# Patient Record
Sex: Male | Born: 1950
Health system: Southern US, Community
[De-identification: ages and names within clinical notes are randomized; demographics above are authoritative.]

## PROBLEM LIST (undated history)

## (undated) DIAGNOSIS — C801 Malignant (primary) neoplasm, unspecified: Secondary | ICD-10-CM

## (undated) DIAGNOSIS — M1990 Unspecified osteoarthritis, unspecified site: Secondary | ICD-10-CM

## (undated) DIAGNOSIS — E785 Hyperlipidemia, unspecified: Secondary | ICD-10-CM

## (undated) DIAGNOSIS — N529 Male erectile dysfunction, unspecified: Secondary | ICD-10-CM

## (undated) DIAGNOSIS — I10 Essential (primary) hypertension: Secondary | ICD-10-CM

## (undated) HISTORY — DX: Malignant (primary) neoplasm, unspecified: C80.1

## (undated) HISTORY — DX: Unspecified osteoarthritis, unspecified site: M19.90

## (undated) HISTORY — DX: Hyperlipidemia, unspecified: E78.5

## (undated) HISTORY — PX: NO PAST SURGERIES: SHX2092

## (undated) HISTORY — DX: Essential (primary) hypertension: I10

## (undated) HISTORY — DX: Male erectile dysfunction, unspecified: N52.9

---

## 2012-01-26 ENCOUNTER — Ambulatory Visit (INDEPENDENT_AMBULATORY_CARE_PROVIDER_SITE_OTHER): Payer: 59 | Admitting: Family Medicine

## 2012-01-26 ENCOUNTER — Encounter: Payer: Self-pay | Admitting: Family Medicine

## 2012-01-26 VITALS — BP 140/90 | HR 100 | Temp 98.0°F | Resp 12 | Ht 69.0 in | Wt 200.0 lb

## 2012-01-26 DIAGNOSIS — I10 Essential (primary) hypertension: Secondary | ICD-10-CM

## 2012-01-26 DIAGNOSIS — M1990 Unspecified osteoarthritis, unspecified site: Secondary | ICD-10-CM

## 2012-01-26 DIAGNOSIS — N529 Male erectile dysfunction, unspecified: Secondary | ICD-10-CM

## 2012-01-26 MED ORDER — SILDENAFIL CITRATE 100 MG PO TABS: 100.0000 mg | ORAL_TABLET | Freq: Every day | ORAL | Status: DC | PRN

## 2012-01-26 MED ORDER — LISINOPRIL 10 MG PO TABS: 10.0000 mg | ORAL_TABLET | Freq: Every day | ORAL | Status: DC

## 2012-01-26 NOTE — Patient Instructions (Addendum)
Consider complete physical at some point later this year. 

## 2012-01-26 NOTE — Progress Notes (Signed)
  Subjective:    Patient ID: Stephen Woods, male    DOB: Nov 04, 1950, 61 y.o.   MRN: [id number]  HPI  Patient seen to establish care. He has history of osteoarthritis mostly involving back, shoulders, knees. He takes over-the-counter anti-inflammatories. Had some recent low back pain possibly related to some lifting. No radiculopathy symptoms. Pain is mild to moderate. Exacerbated by movement.  Hypertension treated with lisinopril 10 mg daily. Blood pressure generally well-controlled. No history of cardiac disease. No prior surgeries. No other prescription medications. No known drug allergies.  Family history is positive for both parents having coronary disease. There were both smokers. Father coronary disease in his 41s and mother 55s. Brother died of complications type 2 diabetes. Father had history of stroke.  Patient recently moved here from [facility] to live with his fianc. Nonsmoker. Only rare alcohol use.  Doing well with the exception of recent problems with erectile dysfunction. No fatigue issues. Normal libido. Symptoms present for about 2 months   Review of Systems  Constitutional: Negative for fatigue and unexpected weight change.  Eyes: Negative for visual disturbance.  Respiratory: Negative for cough, chest tightness and shortness of breath.   Cardiovascular: Negative for chest pain, palpitations and leg swelling.  Gastrointestinal: Negative for abdominal pain.  Genitourinary: Negative for dysuria.  Musculoskeletal: Positive for back pain and arthralgias.  Neurological: Negative for dizziness, syncope, weakness, light-headedness and headaches.       Objective:   Physical Exam  Constitutional: He appears well-developed and well-nourished.  HENT:  Right Ear: External ear normal.  Left Ear: External ear normal.  Mouth/Throat: Oropharynx is clear and moist.  Neck: Neck supple. No thyromegaly present.  Cardiovascular: Normal rate and regular rhythm.  Exam reveals  no gallop.   Pulmonary/Chest: Effort normal and breath sounds normal. No respiratory distress. He has no wheezes. He has no rales.  Musculoskeletal: He exhibits no edema.  Lymphadenopathy:    He has no cervical adenopathy.  Neurological: He is alert.  Psychiatric: He has a normal mood and affect. His behavior is normal.          Assessment & Plan:  #1 hypertension. Stable. Refill lisinopril for one year #2 erectile dysfunction. Discussed risk factors. Trial Viagra 100 mg one half to one tablet daily as needed. No contraindications. #3 osteoarthritis involving multiple joints. Not limiting. Discussed cautions of daily chronic nonsteroidal use #4 recommend complete physical later this year. Flu vaccine declined. He's never had colonoscopy screening but declines.

## 2012-02-20 ENCOUNTER — Telehealth: Payer: Self-pay | Admitting: Family Medicine

## 2012-02-20 NOTE — Telephone Encounter (Signed)
Pt is sch for 03-02-2012 415pm

## 2012-02-20 NOTE — Telephone Encounter (Signed)
Stephen Woods, please schedule pt as requested by Dr Caryl Never, unless he can wait for CPX in December.  Thank you

## 2012-02-20 NOTE — Telephone Encounter (Signed)
Pt is having back pain and  requesting a scan on back.

## 2012-02-20 NOTE — Telephone Encounter (Signed)
New pt 01/26/12, and has CPX labs scheduled, CPX 03/19/12.  Schedule ROV to discuss prior to scan?  Pt has hx of chronic back pain.

## 2012-02-20 NOTE — Telephone Encounter (Signed)
Office follow up to reassess.

## 2012-03-02 ENCOUNTER — Ambulatory Visit (INDEPENDENT_AMBULATORY_CARE_PROVIDER_SITE_OTHER): Payer: 59 | Admitting: Family Medicine

## 2012-03-02 ENCOUNTER — Encounter: Payer: Self-pay | Admitting: Family Medicine

## 2012-03-02 VITALS — BP 120/74 | HR 78 | Temp 97.9°F | Resp 16 | Wt 204.0 lb

## 2012-03-02 DIAGNOSIS — M545 Low back pain: Secondary | ICD-10-CM

## 2012-03-02 DIAGNOSIS — IMO0002 Reserved for concepts with insufficient information to code with codable children: Secondary | ICD-10-CM

## 2012-03-02 DIAGNOSIS — L03114 Cellulitis of left upper limb: Secondary | ICD-10-CM

## 2012-03-02 MED ORDER — CEPHALEXIN 500 MG PO CAPS: 500.0000 mg | ORAL_CAPSULE | Freq: Three times a day (TID) | ORAL | Status: DC

## 2012-03-02 MED ORDER — CELECOXIB 200 MG PO CAPS: 200.0000 mg | ORAL_CAPSULE | Freq: Every day | ORAL | Status: DC

## 2012-03-02 NOTE — Progress Notes (Signed)
  Subjective:    Patient ID: Stephen Woods, male    DOB: 30-May-1950, 61 y.o.   MRN: [id number]  HPI  Patient seen for followup regarding low back pain. Actually improved some since last visit. Some more generalized osteoarthritis. Currently taking ibuprofen. Previously has used Celebrex with good success. He would like to consider prescription for Celebrex. No history of gastrointestinal problems. Denies any epigastric pain or melena.  Left forearm skin lesion. Does not recall specific injury but was carrying some wood couple days ago. Does notice some surrounding redness today. No fever or chills.   Review of Systems  Constitutional: Negative for fever and chills.  Respiratory: Negative for cough and shortness of breath.   Cardiovascular: Negative for chest pain.  Musculoskeletal: Positive for back pain.       Objective:   Physical Exam  Constitutional: He appears well-developed and well-nourished.  Cardiovascular: Normal rate and regular rhythm.   Pulmonary/Chest: Effort normal and breath sounds normal. No respiratory distress. He has no wheezes. He has no rales.  Skin:       Left forearm reveals abraded-type area approximately 2 cm diameter dorsum distal forearm. He has some surrounding erythema which spreads to the ventral aspect of the forearm. This is slightly tender to palpation slightly warm to touch.          Assessment & Plan:  #1 cellulitis left forearm. Keflex 500 mg 3 times a day for 10 days. Followup promptly for any worsening symptoms or if not resolving over the next few days  #2 chronic intermittent low back pain with suspected degenerative arthritis. Prescription for Celebrex 200 mg once daily which has worked well for him in the past.

## 2012-03-02 NOTE — Patient Instructions (Addendum)
Cellulitis Cellulitis is an infection of the skin and the tissue beneath it. The infected area is usually red and tender. Cellulitis occurs most often in the arms and lower legs.   CAUSES   Cellulitis is caused by bacteria that enter the skin through cracks or cuts in the skin. The most common types of bacteria that cause cellulitis are Staphylococcus and Streptococcus. SYMPTOMS    Redness and warmth.   Swelling.   Tenderness or pain.   Fever.  DIAGNOSIS  Your caregiver can usually determine what is wrong based on a physical exam. Blood tests may also be done. TREATMENT   Treatment usually involves taking an antibiotic medicine. HOME CARE INSTRUCTIONS    Take your antibiotics as directed. Finish them even if you start to feel better.   Keep the infected arm or leg elevated to reduce swelling.   Apply a warm cloth to the affected area up to 4 times per day to relieve pain.   Only take over-the-counter or prescription medicines for pain, discomfort, or fever as directed by your caregiver.   Keep all follow-up appointments as directed by your caregiver.  SEEK MEDICAL CARE IF:    You notice red streaks coming from the infected area.   Your red area gets larger or turns dark in color.   Your bone or joint underneath the infected area becomes painful after the skin has healed.   Your infection returns in the same area or another area.   You notice a swollen bump in the infected area.   You develop new symptoms.  SEEK IMMEDIATE MEDICAL CARE IF:    You have a fever.   You feel very sleepy.   You develop vomiting or diarrhea.   You have a general ill feeling (malaise) with muscle aches and pains.  MAKE SURE YOU:    Understand these instructions.   Will watch your condition.   Will get help right away if you are not doing well or get worse.  Document Released: 01/12/2005 Document Revised: 10/04/2011 Document Reviewed: 06/20/2011 [employer] Patient Information 2013  [employer]    

## 2012-03-05 ENCOUNTER — Other Ambulatory Visit (INDEPENDENT_AMBULATORY_CARE_PROVIDER_SITE_OTHER): Payer: 59

## 2012-03-05 DIAGNOSIS — Z0000 Encounter for general adult medical examination without abnormal findings: Secondary | ICD-10-CM

## 2012-03-05 LAB — CBC WITH DIFFERENTIAL/PLATELET
Basophils Relative: 0.8 % (ref 0.0–3.0)
Eosinophils Absolute: 0.3 10*3/uL (ref 0.0–0.7)
Eosinophils Relative: 5 % (ref 0.0–5.0)
Hemoglobin: 15.6 g/dL (ref 13.0–17.0)
Lymphocytes Relative: 37.1 % (ref 12.0–46.0)
MCHC: 33 g/dL (ref 30.0–36.0)
Monocytes Relative: 11.9 % (ref 3.0–12.0)
Neutro Abs: 2.6 10*3/uL (ref 1.4–7.7)
Neutrophils Relative %: 45.2 % (ref 43.0–77.0)
RBC: 4.98 Mil/uL (ref 4.22–5.81)
WBC: 5.7 10*3/uL (ref 4.5–10.5)

## 2012-03-05 LAB — POCT URINALYSIS DIPSTICK
Bilirubin, UA: NEGATIVE
Glucose, UA: NEGATIVE
Leukocytes, UA: NEGATIVE
Nitrite, UA: NEGATIVE
Urobilinogen, UA: 0.2

## 2012-03-05 LAB — HEPATIC FUNCTION PANEL
AST: 39 U/L — ABNORMAL HIGH (ref 0–37)
Alkaline Phosphatase: 40 U/L (ref 39–117)
Bilirubin, Direct: 0.2 mg/dL (ref 0.0–0.3)
Total Protein: 7.6 g/dL (ref 6.0–8.3)

## 2012-03-05 LAB — BASIC METABOLIC PANEL
Calcium: 9.6 mg/dL (ref 8.4–10.5)
Creatinine, Ser: 0.8 mg/dL (ref 0.4–1.5)
GFR: 100.09 mL/min (ref >60.00)
Sodium: 136 mEq/L (ref 135–145)

## 2012-03-05 LAB — LIPID PANEL
HDL: 60.6 mg/dL (ref >39.00)
Triglycerides: 84 mg/dL (ref 0.0–149.0)

## 2012-03-07 ENCOUNTER — Other Ambulatory Visit: Payer: 59

## 2012-03-19 ENCOUNTER — Encounter: Payer: 59 | Admitting: Family Medicine

## 2012-04-13 ENCOUNTER — Encounter: Payer: Self-pay | Admitting: Family Medicine

## 2012-04-13 ENCOUNTER — Ambulatory Visit (INDEPENDENT_AMBULATORY_CARE_PROVIDER_SITE_OTHER): Payer: 59 | Admitting: Family Medicine

## 2012-04-13 VITALS — BP 124/80 | HR 80 | Temp 98.3°F | Resp 16 | Ht 69.0 in | Wt 202.0 lb

## 2012-04-13 DIAGNOSIS — E785 Hyperlipidemia, unspecified: Secondary | ICD-10-CM

## 2012-04-13 DIAGNOSIS — R319 Hematuria, unspecified: Secondary | ICD-10-CM

## 2012-04-13 DIAGNOSIS — Z0000 Encounter for general adult medical examination without abnormal findings: Secondary | ICD-10-CM

## 2012-04-13 LAB — POCT URINALYSIS DIPSTICK
Bilirubin, UA: NEGATIVE
Blood, UA: NEGATIVE
Glucose, UA: NEGATIVE
Leukocytes, UA: NEGATIVE
Nitrite, UA: NEGATIVE
pH, UA: 6

## 2012-04-13 MED ORDER — ATORVASTATIN CALCIUM 20 MG PO TABS: 20.0000 mg | ORAL_TABLET | Freq: Every day | ORAL | Status: DC

## 2012-04-13 NOTE — Patient Instructions (Addendum)
Consider yearly flu vaccine Consider one time shingles vaccine Let me know if you are willing to schedule colonoscopy.

## 2012-04-13 NOTE — Progress Notes (Signed)
  Subjective:    Patient ID: Stephen Woods, male    DOB: 12/14/1950, 61 y.o.   MRN: [id number]  HPI Here for complete physical. Patient has history of osteoarthritis and hypertension. Blood pressures been well controlled. Last tetanus 2008. Patient declines flu vaccine. No history of screening colonoscopy and he declines. Never had shingles vaccine. Strong family history of CAD in both parents and brother. Patient never had any chest pains. Nonsmoker. No regular exercise.  Past Medical History  Diagnosis Date  . Arthritis   . Hypertension   . Erectile dysfunction    No past surgical history on file.  reports that he has never smoked. He does not have any smokeless tobacco history on file. His alcohol and drug histories not on file. family history includes Arthritis in his mother; Diabetes in his brother; Heart disease (age of onset:55) in his father; Heart disease (age of onset:60) in his mother; Heart disease (age of onset:64) in his brother; Hypertension in his father; and Stroke in his father. Allergies  Allergen Reactions  . Ampicillin     hives      Review of Systems  Constitutional: Negative for fever, activity change, appetite change, fatigue and unexpected weight change.  HENT: Negative for ear pain, congestion and trouble swallowing.   Eyes: Negative for pain and visual disturbance.  Respiratory: Negative for cough, shortness of breath and wheezing.   Cardiovascular: Negative for chest pain and palpitations.  Gastrointestinal: Negative for nausea, vomiting, abdominal pain, diarrhea, constipation, blood in stool, abdominal distention and rectal pain.  Genitourinary: Negative for dysuria, hematuria and testicular pain.  Musculoskeletal: Positive for arthralgias. Negative for joint swelling.  Skin: Negative for rash.  Neurological: Negative for dizziness, syncope and headaches.  Hematological: Negative for adenopathy.  Psychiatric/Behavioral: Negative for confusion and  dysphoric mood.       Objective:   Physical Exam  Constitutional: He is oriented to person, place, and time. He appears well-developed and well-nourished. No distress.  HENT:  Head: Normocephalic and atraumatic.  Right Ear: External ear normal.  Left Ear: External ear normal.  Mouth/Throat: Oropharynx is clear and moist.  Eyes: Conjunctivae normal and EOM are normal. Pupils are equal, round, and reactive to light.  Neck: Normal range of motion. Neck supple. No thyromegaly present.  Cardiovascular: Normal rate, regular rhythm and normal heart sounds.   No murmur heard. Pulmonary/Chest: No respiratory distress. He has no wheezes. He has no rales.  Abdominal: Soft. Bowel sounds are normal. He exhibits no distension and no mass. There is no tenderness. There is no rebound and no guarding.  Genitourinary: Rectum normal and prostate normal.  Musculoskeletal: He exhibits no edema.  Lymphadenopathy:    He has no cervical adenopathy.  Neurological: He is alert and oriented to person, place, and time. He displays normal reflexes. No cranial nerve deficit.  Skin: No rash noted.  Psychiatric: He has a normal mood and affect.          Assessment & Plan:  Complete physical. Tetanus up-to-date. Patient declines flu vaccine or shingles vaccine. Declines colonoscopy. Agrees to Hemoccult cards. 12% ATP risk of CAD in 10 years. Labs reviewed with patient. Given strong family history of premature CAD start Lipitor 20 mg daily and repeat lipids and hepatic in 6-8 weeks. Repeat urine dipstick with trace blood noted initially. Urine micro-if still positive

## 2012-05-21 ENCOUNTER — Telehealth: Payer: Self-pay | Admitting: Family Medicine

## 2012-05-21 ENCOUNTER — Encounter: Payer: Self-pay | Admitting: Family Medicine

## 2012-05-21 NOTE — Telephone Encounter (Signed)
A follow up appointment made with Dr Caryl Never to review lab results.

## 2012-05-21 NOTE — Telephone Encounter (Signed)
Pt is trying to get life insurance and got cholesterol test result of 134. Pt has been on  LIPITOR. Pls advise.  OK to leave message.

## 2012-06-04 ENCOUNTER — Encounter: Payer: Self-pay | Admitting: Family Medicine

## 2012-06-04 ENCOUNTER — Ambulatory Visit (INDEPENDENT_AMBULATORY_CARE_PROVIDER_SITE_OTHER): Payer: 59 | Admitting: Family Medicine

## 2012-06-04 ENCOUNTER — Other Ambulatory Visit: Payer: 59

## 2012-06-04 VITALS — BP 140/80 | Temp 98.4°F | Wt 202.0 lb

## 2012-06-04 DIAGNOSIS — E785 Hyperlipidemia, unspecified: Secondary | ICD-10-CM

## 2012-06-04 LAB — LIPID PANEL
HDL: 47.3 mg/dL (ref >39.00)
Triglycerides: 86 mg/dL (ref 0.0–149.0)
VLDL: 17.2 mg/dL (ref 0.0–40.0)

## 2012-06-04 LAB — HEPATIC FUNCTION PANEL
ALT: 30 U/L (ref 0–53)
AST: 26 U/L (ref 0–37)
Albumin: 4 g/dL (ref 3.5–5.2)
Total Bilirubin: 0.6 mg/dL (ref 0.3–1.2)
Total Protein: 7.4 g/dL (ref 6.0–8.3)

## 2012-06-04 NOTE — Progress Notes (Signed)
  Subjective:    Patient ID: Stephen Woods, male    DOB: 22-Feb-1951, 62 y.o.   MRN: [id number]  HPI Followup hyperlipidemia. Patient recent physical. Predicted 10 year risk of CAD 12%. Strong family history. Cholesterol 239. Started Lipitor 20 mg once daily. Tolerating well without side effects. Patient relates insurance physical last month with total cholesterol 138 and he was told this was" too low". He apparently had mildly elevated liver transaminases. No alcohol use.  Past Medical History  Diagnosis Date  . Arthritis   . Hypertension   . Erectile dysfunction    No past surgical history on file.  reports that he has quit smoking. He started smoking about 8 weeks ago. He does not have any smokeless tobacco history on file. His alcohol and drug histories are not on file. family history includes Arthritis in his mother; Diabetes in his brother; Heart disease (age of onset: 51) in his father; Heart disease (age of onset: 62) in his mother; Heart disease (age of onset: 53) in his brother; Hypertension in his father; and Stroke in his father. Allergies  Allergen Reactions  . Ampicillin     hives      Review of Systems  Constitutional: Negative for fatigue.  Eyes: Negative for visual disturbance.  Respiratory: Negative for cough, chest tightness and shortness of breath.   Cardiovascular: Negative for chest pain, palpitations and leg swelling.  Neurological: Negative for dizziness, syncope, weakness, light-headedness and headaches.       Objective:   Physical Exam  Constitutional: He appears well-developed and well-nourished. No distress.  Cardiovascular: Normal rate and regular rhythm.   Pulmonary/Chest: Effort normal and breath sounds normal. No respiratory distress. He has no wheezes. He has no rales.  Musculoskeletal: He exhibits no edema.          Assessment & Plan:  Hyperlipidemia. Reported recent mild elevated liver transaminases with insurance physical. Recheck lipid  and hepatic panel today.

## 2012-06-06 NOTE — Progress Notes (Signed)
Quick Note:  Pt informed on only phone number available VM ______

## 2012-06-07 ENCOUNTER — Encounter: Payer: Self-pay | Admitting: Family Medicine

## 2012-07-30 ENCOUNTER — Telehealth: Payer: Self-pay | Admitting: Family Medicine

## 2012-07-30 MED ORDER — SILDENAFIL CITRATE 100 MG PO TABS: 100.0000 mg | ORAL_TABLET | Freq: Every day | ORAL | Status: DC | PRN

## 2012-07-30 NOTE — Telephone Encounter (Signed)
Pt wife calling to request a RX of Sildenafil 20mg  #40. PT would like it called into [facility] on [address].

## 2012-07-31 ENCOUNTER — Other Ambulatory Visit: Payer: Self-pay | Admitting: *Deleted

## 2012-07-31 ENCOUNTER — Telehealth: Payer: Self-pay | Admitting: Family Medicine

## 2012-07-31 MED ORDER — SILDENAFIL CITRATE 100 MG PO TABS: 100.0000 mg | ORAL_TABLET | Freq: Every day | ORAL | Status: DC | PRN

## 2012-07-31 NOTE — Telephone Encounter (Signed)
Patient's wife called back to ask if he can have a quantity of 40 viagra at a time as this will be cheaper and they will even pay out of pocket if necessary. Please advise.

## 2012-08-01 ENCOUNTER — Telehealth: Payer: Self-pay | Admitting: *Deleted

## 2012-08-01 MED ORDER — SILDENAFIL CITRATE 100 MG PO TABS: 100.0000 mg | ORAL_TABLET | Freq: Every day | ORAL | Status: DC | PRN

## 2012-08-01 MED ORDER — SILDENAFIL CITRATE 20 MG PO TABS: 20.0000 mg | ORAL_TABLET | ORAL | Status: DC | PRN

## 2012-08-01 NOTE — Telephone Encounter (Signed)
Pt wife requesting sildenafil 20 mg prn, #40 at [facility] because they will cost them $52.00

## 2012-11-22 ENCOUNTER — Other Ambulatory Visit: Payer: Self-pay | Admitting: Family Medicine

## 2013-01-19 ENCOUNTER — Other Ambulatory Visit: Payer: Self-pay | Admitting: Family Medicine

## 2013-04-10 ENCOUNTER — Other Ambulatory Visit (INDEPENDENT_AMBULATORY_CARE_PROVIDER_SITE_OTHER): Payer: 59

## 2013-04-10 DIAGNOSIS — Z0000 Encounter for general adult medical examination without abnormal findings: Secondary | ICD-10-CM

## 2013-04-10 LAB — BASIC METABOLIC PANEL
BUN: 16 mg/dL (ref 6–23)
Calcium: 9.2 mg/dL (ref 8.4–10.5)
GFR: 110.41 mL/min (ref >60.00)
Potassium: 4.6 mEq/L (ref 3.5–5.1)
Sodium: 138 mEq/L (ref 135–145)

## 2013-04-10 LAB — PSA: PSA: 0.95 ng/mL (ref 0.10–4.00)

## 2013-04-10 LAB — POCT URINALYSIS DIPSTICK
Blood, UA: NEGATIVE
Ketones, UA: NEGATIVE
Protein, UA: NEGATIVE
Spec Grav, UA: 1.02
Urobilinogen, UA: 0.2
pH, UA: 5.5

## 2013-04-10 LAB — CBC WITH DIFFERENTIAL/PLATELET
Basophils Absolute: 0 10*3/uL (ref 0.0–0.1)
Eosinophils Relative: 4.1 % (ref 0.0–5.0)
HCT: 44.9 % (ref 39.0–52.0)
Lymphocytes Relative: 34.2 % (ref 12.0–46.0)
Lymphs Abs: 2.1 10*3/uL (ref 0.7–4.0)
Monocytes Relative: 12.5 % — ABNORMAL HIGH (ref 3.0–12.0)
Platelets: 180 10*3/uL (ref 150.0–400.0)
RDW: 14.1 % (ref 11.5–14.6)
WBC: 6.1 10*3/uL (ref 4.5–10.5)

## 2013-04-10 LAB — LIPID PANEL
Cholesterol: 120 mg/dL (ref 0–200)
HDL: 42 mg/dL (ref >39.00)
LDL Cholesterol: 68 mg/dL (ref 0–99)
Triglycerides: 48 mg/dL (ref 0.0–149.0)
VLDL: 9.6 mg/dL (ref 0.0–40.0)

## 2013-04-10 LAB — HEPATIC FUNCTION PANEL
ALT: 26 U/L (ref 0–53)
AST: 23 U/L (ref 0–37)
Albumin: 4.1 g/dL (ref 3.5–5.2)
Alkaline Phosphatase: 53 U/L (ref 39–117)
Bilirubin, Direct: 0.2 mg/dL (ref 0.0–0.3)
Total Protein: 7 g/dL (ref 6.0–8.3)

## 2013-04-19 ENCOUNTER — Encounter: Payer: 59 | Admitting: Family Medicine

## 2013-04-26 ENCOUNTER — Encounter: Payer: Self-pay | Admitting: Family Medicine

## 2013-04-26 ENCOUNTER — Ambulatory Visit (INDEPENDENT_AMBULATORY_CARE_PROVIDER_SITE_OTHER): Payer: 59 | Admitting: Family Medicine

## 2013-04-26 ENCOUNTER — Other Ambulatory Visit: Payer: Self-pay

## 2013-04-26 VITALS — BP 128/72 | HR 91 | Temp 98.0°F | Ht 69.0 in | Wt 210.0 lb

## 2013-04-26 DIAGNOSIS — Z0000 Encounter for general adult medical examination without abnormal findings: Secondary | ICD-10-CM

## 2013-04-26 MED ORDER — ATORVASTATIN CALCIUM 20 MG PO TABS: 20.0000 mg | ORAL_TABLET | Freq: Every day | ORAL | Status: DC

## 2013-04-26 MED ORDER — SILDENAFIL CITRATE 20 MG PO TABS: 20.0000 mg | ORAL_TABLET | Freq: Every day | ORAL | Status: DC

## 2013-04-26 MED ORDER — LISINOPRIL 10 MG PO TABS: 10.0000 mg | ORAL_TABLET | Freq: Every day | ORAL | Status: DC

## 2013-04-26 NOTE — Patient Instructions (Signed)
Check on insurance coverage for shingles vaccine Let me know when you are ready to schedule colonoscopy.

## 2013-04-26 NOTE — Progress Notes (Signed)
   Subjective:    Patient ID: Stephen Woods, male    DOB: 1951/02/13, 63 y.o.   MRN: [id number]  HPI Patient seen for complete physical He has very strong family history of CAD in both parents and brother. He has not had any recent chest pain. He has hypertension and hyperlipidemia which have been well controlled. Nonsmoker. He takes aspirin one daily. No consistent exercise.  He declines flu vaccine. No history of shingles vaccine.  Declines colonoscopy.  Past Medical History  Diagnosis Date  . Arthritis   . Hypertension   . Erectile dysfunction    No past surgical history on file.  reports that he has quit smoking. He started smoking about 12 months ago. He does not have any smokeless tobacco history on file. His alcohol and drug histories are not on file. family history includes Arthritis in his mother; Diabetes in his brother; Heart disease (age of onset: 12) in his father; Heart disease (age of onset: 6) in his mother; Heart disease (age of onset: 3) in his brother; Hypertension in his father; Stroke in his father. Allergies  Allergen Reactions  . Ampicillin     hives      Review of Systems  Constitutional: Negative for fever, activity change, appetite change and fatigue.  HENT: Negative for congestion, ear pain and trouble swallowing.   Eyes: Negative for pain and visual disturbance.  Respiratory: Negative for cough, shortness of breath and wheezing.   Cardiovascular: Negative for chest pain and palpitations.  Gastrointestinal: Negative for nausea, vomiting, abdominal pain, diarrhea, constipation, blood in stool, abdominal distention and rectal pain.  Genitourinary: Negative for dysuria, hematuria and testicular pain.  Musculoskeletal: Negative for arthralgias and joint swelling.  Skin: Negative for rash.  Neurological: Negative for dizziness, syncope and headaches.  Hematological: Negative for adenopathy.  Psychiatric/Behavioral: Negative for confusion and dysphoric  mood.       Objective:   Physical Exam  Constitutional: He is oriented to person, place, and time. He appears well-developed and well-nourished. No distress.  HENT:  Head: Normocephalic and atraumatic.  Right Ear: External ear normal.  Left Ear: External ear normal.  Mouth/Throat: Oropharynx is clear and moist.  Eyes: Conjunctivae and EOM are normal. Pupils are equal, round, and reactive to light.  Neck: Normal range of motion. Neck supple. No thyromegaly present.  Cardiovascular: Normal rate, regular rhythm and normal heart sounds.   No murmur heard. Pulmonary/Chest: No respiratory distress. He has no wheezes. He has no rales.  Abdominal: Soft. Bowel sounds are normal. He exhibits no distension and no mass. There is no tenderness. There is no rebound and no guarding.  Musculoskeletal: He exhibits no edema.  Lymphadenopathy:    He has no cervical adenopathy.  Neurological: He is alert and oriented to person, place, and time. He displays normal reflexes. No cranial nerve deficit.  Skin: No rash noted.  Psychiatric: He has a normal mood and affect.          Assessment & Plan:  Complete physical. We've recommended flu vaccine and he declines. He'll check on coverage for shingles vaccine. He declines colonoscopy but will consider later this year. Labs reviewed. No major concerns. Medications refilled for one year

## 2013-04-26 NOTE — Progress Notes (Signed)
Pre visit review using our clinic review tool, if applicable. No additional management support is needed unless otherwise documented below in the visit note. 

## 2014-04-21 ENCOUNTER — Other Ambulatory Visit: Payer: Self-pay | Admitting: Family Medicine

## 2014-04-22 ENCOUNTER — Other Ambulatory Visit (INDEPENDENT_AMBULATORY_CARE_PROVIDER_SITE_OTHER): Payer: 59

## 2014-04-22 DIAGNOSIS — Z0000 Encounter for general adult medical examination without abnormal findings: Secondary | ICD-10-CM

## 2014-04-22 LAB — CBC WITH DIFFERENTIAL/PLATELET
BASOS ABS: 0 10*3/uL (ref 0.0–0.1)
Basophils Relative: 0.6 % (ref 0.0–3.0)
Eosinophils Absolute: 0.3 10*3/uL (ref 0.0–0.7)
Eosinophils Relative: 4.9 % (ref 0.0–5.0)
HCT: 43.3 % (ref 39.0–52.0)
Hemoglobin: 14.7 g/dL (ref 13.0–17.0)
LYMPHS PCT: 34.4 % (ref 12.0–46.0)
Lymphs Abs: 2.4 10*3/uL (ref 0.7–4.0)
MCHC: 34 g/dL (ref 30.0–36.0)
MCV: 90.1 fl (ref 78.0–100.0)
MONO ABS: 0.9 10*3/uL (ref 0.1–1.0)
Monocytes Relative: 12.8 % — ABNORMAL HIGH (ref 3.0–12.0)
NEUTROS PCT: 47.3 % (ref 43.0–77.0)
Neutro Abs: 3.3 10*3/uL (ref 1.4–7.7)
Platelets: 167 10*3/uL (ref 150.0–400.0)
RBC: 4.8 Mil/uL (ref 4.22–5.81)
RDW: 14.1 % (ref 11.5–15.5)
WBC: 7 10*3/uL (ref 4.0–10.5)

## 2014-04-22 LAB — BASIC METABOLIC PANEL
BUN: 25 mg/dL — ABNORMAL HIGH (ref 6–23)
CALCIUM: 9.5 mg/dL (ref 8.4–10.5)
CHLORIDE: 105 meq/L (ref 96–112)
CO2: 28 meq/L (ref 19–32)
Creatinine, Ser: 0.8 mg/dL (ref 0.4–1.5)
GFR: 106.79 mL/min (ref >60.00)
Glucose, Bld: 118 mg/dL — ABNORMAL HIGH (ref 70–99)
POTASSIUM: 5.3 meq/L — AB (ref 3.5–5.1)
SODIUM: 139 meq/L (ref 135–145)

## 2014-04-22 LAB — LIPID PANEL
Cholesterol: 138 mg/dL (ref 0–200)
HDL: 38.1 mg/dL — ABNORMAL LOW (ref >39.00)
LDL Cholesterol: 82 mg/dL (ref 0–99)
NonHDL: 99.9
Total CHOL/HDL Ratio: 4
Triglycerides: 88 mg/dL (ref 0.0–149.0)
VLDL: 17.6 mg/dL (ref 0.0–40.0)

## 2014-04-22 LAB — HEPATIC FUNCTION PANEL
ALBUMIN: 4.1 g/dL (ref 3.5–5.2)
ALT: 34 U/L (ref 0–53)
AST: 35 U/L (ref 0–37)
Alkaline Phosphatase: 56 U/L (ref 39–117)
Bilirubin, Direct: 0.3 mg/dL (ref 0.0–0.3)
Total Bilirubin: 1.2 mg/dL (ref 0.2–1.2)
Total Protein: 7.4 g/dL (ref 6.0–8.3)

## 2014-04-22 LAB — TSH: TSH: 0.79 u[IU]/mL (ref 0.35–4.50)

## 2014-04-22 LAB — POCT URINALYSIS DIPSTICK
Bilirubin, UA: NEGATIVE
GLUCOSE UA: NEGATIVE
Ketones, UA: NEGATIVE
Leukocytes, UA: NEGATIVE
Nitrite, UA: NEGATIVE
Protein, UA: NEGATIVE
Spec Grav, UA: 1.02
UROBILINOGEN UA: 0.2
pH, UA: 6

## 2014-04-22 LAB — PSA: PSA: 1.26 ng/mL (ref 0.10–4.00)

## 2014-04-23 ENCOUNTER — Other Ambulatory Visit: Payer: Self-pay | Admitting: Family Medicine

## 2014-04-28 ENCOUNTER — Ambulatory Visit (INDEPENDENT_AMBULATORY_CARE_PROVIDER_SITE_OTHER): Payer: 59 | Admitting: Family Medicine

## 2014-04-28 ENCOUNTER — Encounter: Payer: Self-pay | Admitting: Family Medicine

## 2014-04-28 VITALS — BP 132/80 | HR 92 | Temp 98.0°F | Ht 69.0 in | Wt 219.0 lb

## 2014-04-28 DIAGNOSIS — R319 Hematuria, unspecified: Secondary | ICD-10-CM

## 2014-04-28 DIAGNOSIS — Z0000 Encounter for general adult medical examination without abnormal findings: Secondary | ICD-10-CM

## 2014-04-28 LAB — POCT URINALYSIS DIPSTICK
BILIRUBIN UA: NEGATIVE
Blood, UA: NEGATIVE
Glucose, UA: NEGATIVE
Ketones, UA: NEGATIVE
LEUKOCYTES UA: NEGATIVE
Nitrite, UA: NEGATIVE
PROTEIN UA: NEGATIVE
Spec Grav, UA: 1.015
Urobilinogen, UA: 0.2
pH, UA: 6.5

## 2014-04-28 MED ORDER — SILDENAFIL CITRATE 20 MG PO TABS: 20.0000 mg | ORAL_TABLET | Freq: Every day | ORAL | Status: DC

## 2014-04-28 MED ORDER — LISINOPRIL 10 MG PO TABS: 10.0000 mg | ORAL_TABLET | Freq: Every day | ORAL | Status: DC

## 2014-04-28 MED ORDER — ATORVASTATIN CALCIUM 20 MG PO TABS: 20.0000 mg | ORAL_TABLET | Freq: Every day | ORAL | Status: DC

## 2014-04-28 NOTE — Progress Notes (Signed)
   Subjective:    Patient ID: Stephen Woods, male    DOB: [birth year], 64 y.o.   MRN: [id number]  HPI Patient here for complete physical. He has chronic problems including hyperlipidemia and hypertension. Medications reviewed. Poor compliance with diet and exercise. He has gained about 10 pounds over the past year. Strong family history of CAD. Patient is nonsmoker. He declines flu vaccine, shingles vaccine, and colonoscopy. Tetanus up-to-date still.   Reviewed with no changes:  Past Medical History  Diagnosis Date  . Arthritis   . Hypertension   . Erectile dysfunction    No past surgical history on file.  reports that he has quit smoking. He started smoking about 2 years ago. He does not have any smokeless tobacco history on file. His alcohol and drug histories are not on file. family history includes Arthritis in his mother; Diabetes in his brother; Heart disease (age of onset: 45) in his father; Heart disease (age of onset: 84) in his mother; Heart disease (age of onset: 71) in his brother; Hypertension in his father; Stroke in his father. Allergies  Allergen Reactions  . Ampicillin     hives      Review of Systems  Constitutional: Negative for fever, activity change, appetite change and fatigue.  HENT: Negative for congestion, ear pain and trouble swallowing.   Eyes: Negative for pain and visual disturbance.  Respiratory: Negative for cough, shortness of breath and wheezing.   Cardiovascular: Negative for chest pain and palpitations.  Gastrointestinal: Negative for nausea, vomiting, abdominal pain, diarrhea, constipation, blood in stool, abdominal distention and rectal pain.  Endocrine: Negative for polydipsia and polyuria.  Genitourinary: Negative for dysuria, hematuria and testicular pain.  Musculoskeletal: Negative for joint swelling and arthralgias.  Skin: Negative for rash.  Neurological: Negative for dizziness, syncope and headaches.  Hematological: Negative for  adenopathy.  Psychiatric/Behavioral: Negative for confusion and dysphoric mood.       Objective:   Physical Exam  Constitutional: He is oriented to person, place, and time. He appears well-developed and well-nourished. No distress.  HENT:  Head: Normocephalic and atraumatic.  Right Ear: External ear normal.  Left Ear: External ear normal.  Mouth/Throat: Oropharynx is clear and moist.  Eyes: Conjunctivae and EOM are normal. Pupils are equal, round, and reactive to light.  Neck: Normal range of motion. Neck supple. No thyromegaly present.  Cardiovascular: Normal rate, regular rhythm and normal heart sounds.   No murmur heard. Pulmonary/Chest: No respiratory distress. He has no wheezes. He has no rales.  Abdominal: Soft. Bowel sounds are normal. He exhibits no distension and no mass. There is no tenderness. There is no rebound and no guarding.  Musculoskeletal: He exhibits no edema.  Lymphadenopathy:    He has no cervical adenopathy.  Neurological: He is alert and oriented to person, place, and time. He displays normal reflexes. No cranial nerve deficit.  Skin: No rash noted.  Psychiatric: He has a normal mood and affect.          Assessment & Plan:   Complete physical. Labs reviewed. Blood sugars are elevated to 118. We strongly advise weight loss and spent considerable time discussing dietary changes.  He will try to reduce things like ice cream and potatoes and other white starches and refined sugars. Consider repeat fasting blood sugar in 6 months. We've recommended flu vaccine and shingles and he declines both. He also declines colonoscopy or any other colon cancer screening

## 2014-04-28 NOTE — Patient Instructions (Signed)
Try to lose some weight Exercise regularly - as many days per week as possible Let me know if you change your mind about shingles vaccine or colonoscopy.

## 2014-04-28 NOTE — Progress Notes (Signed)
Pre visit review using our clinic review tool, if applicable. No additional management support is needed unless otherwise documented below in the visit note. 

## 2014-05-21 ENCOUNTER — Other Ambulatory Visit: Payer: Self-pay | Admitting: Family Medicine

## 2015-03-09 ENCOUNTER — Other Ambulatory Visit: Payer: Self-pay | Admitting: Family Medicine

## 2015-04-28 ENCOUNTER — Other Ambulatory Visit: Payer: Self-pay | Admitting: Family Medicine

## 2015-04-30 ENCOUNTER — Encounter: Payer: 59 | Admitting: Family Medicine

## 2015-05-01 ENCOUNTER — Other Ambulatory Visit (INDEPENDENT_AMBULATORY_CARE_PROVIDER_SITE_OTHER): Payer: Commercial Managed Care - HMO

## 2015-05-01 DIAGNOSIS — Z0000 Encounter for general adult medical examination without abnormal findings: Secondary | ICD-10-CM

## 2015-05-01 LAB — CBC WITH DIFFERENTIAL/PLATELET
BASOS PCT: 0.6 % (ref 0.0–3.0)
Basophils Absolute: 0 10*3/uL (ref 0.0–0.1)
EOS PCT: 6.1 % — AB (ref 0.0–5.0)
Eosinophils Absolute: 0.4 10*3/uL (ref 0.0–0.7)
HEMATOCRIT: 45.5 % (ref 39.0–52.0)
HEMOGLOBIN: 14.9 g/dL (ref 13.0–17.0)
LYMPHS PCT: 30.5 % (ref 12.0–46.0)
Lymphs Abs: 1.8 10*3/uL (ref 0.7–4.0)
MCHC: 32.8 g/dL (ref 30.0–36.0)
MCV: 90.8 fl (ref 78.0–100.0)
MONO ABS: 0.7 10*3/uL (ref 0.1–1.0)
Monocytes Relative: 12.9 % — ABNORMAL HIGH (ref 3.0–12.0)
Neutro Abs: 2.9 10*3/uL (ref 1.4–7.7)
Neutrophils Relative %: 49.9 % (ref 43.0–77.0)
Platelets: 163 10*3/uL (ref 150.0–400.0)
RBC: 5.01 Mil/uL (ref 4.22–5.81)
RDW: 14.2 % (ref 11.5–15.5)
WBC: 5.8 10*3/uL (ref 4.0–10.5)

## 2015-05-01 LAB — LIPID PANEL
CHOLESTEROL: 130 mg/dL (ref 0–200)
HDL: 45.3 mg/dL (ref >39.00)
LDL Cholesterol: 73 mg/dL (ref 0–99)
NonHDL: 84.53
TRIGLYCERIDES: 58 mg/dL (ref 0.0–149.0)
Total CHOL/HDL Ratio: 3
VLDL: 11.6 mg/dL (ref 0.0–40.0)

## 2015-05-01 LAB — BASIC METABOLIC PANEL
BUN: 25 mg/dL — AB (ref 6–23)
CHLORIDE: 103 meq/L (ref 96–112)
CO2: 31 mEq/L (ref 19–32)
Calcium: 9.4 mg/dL (ref 8.4–10.5)
Creatinine, Ser: 0.73 mg/dL (ref 0.40–1.50)
GFR: 114.9 mL/min (ref >60.00)
Glucose, Bld: 108 mg/dL — ABNORMAL HIGH (ref 70–99)
POTASSIUM: 4.9 meq/L (ref 3.5–5.1)
Sodium: 141 mEq/L (ref 135–145)

## 2015-05-01 LAB — HEPATIC FUNCTION PANEL
ALBUMIN: 4.2 g/dL (ref 3.5–5.2)
ALT: 29 U/L (ref 0–53)
AST: 24 U/L (ref 0–37)
Alkaline Phosphatase: 55 U/L (ref 39–117)
Bilirubin, Direct: 0.2 mg/dL (ref 0.0–0.3)
TOTAL PROTEIN: 7.4 g/dL (ref 6.0–8.3)
Total Bilirubin: 0.8 mg/dL (ref 0.2–1.2)

## 2015-05-01 LAB — TSH: TSH: 0.99 u[IU]/mL (ref 0.35–4.50)

## 2015-05-01 LAB — PSA: PSA: 1.17 ng/mL (ref 0.10–4.00)

## 2015-05-07 ENCOUNTER — Ambulatory Visit (INDEPENDENT_AMBULATORY_CARE_PROVIDER_SITE_OTHER): Payer: Commercial Managed Care - HMO | Admitting: Family Medicine

## 2015-05-07 ENCOUNTER — Encounter: Payer: Self-pay | Admitting: Family Medicine

## 2015-05-07 VITALS — BP 120/84 | HR 80 | Temp 98.3°F | Ht 68.75 in | Wt 210.4 lb

## 2015-05-07 DIAGNOSIS — Z0000 Encounter for general adult medical examination without abnormal findings: Secondary | ICD-10-CM

## 2015-05-07 MED ORDER — SILDENAFIL CITRATE 20 MG PO TABS: 20.0000 mg | ORAL_TABLET | Freq: Every day | ORAL | Status: DC | PRN

## 2015-05-07 NOTE — Progress Notes (Signed)
   Subjective:    Patient ID: Stephen Woods, male    DOB: 21-Jul-1950, 65 y.o.   MRN: [id number]  HPI Patient here for physical exam. He has hyperlipidemia and hypertension as well as erectile dysfunction. Medications reviewed. Compliant with all. No history of shingles vaccine. He has not had colon cancer screening. He is unsure of insurance coverage. His tetanus up-to-date. He declines flu vaccination.  He does not want to pursue setting up colonoscopy until he checks with insurance.  Past Medical History  Diagnosis Date  . Arthritis   . Hypertension   . Erectile dysfunction    History reviewed. No pertinent past surgical history.  reports that he has quit smoking. He started smoking about 3 years ago. He does not have any smokeless tobacco history on file. His alcohol and drug histories are not on file. family history includes Arthritis in his mother; Diabetes in his brother; Heart disease (age of onset: 30) in his father; Heart disease (age of onset: 38) in his mother; Heart disease (age of onset: 3) in his brother; Hypertension in his father; Stroke in his father. Allergies  Allergen Reactions  . Ampicillin     hives      Review of Systems  Constitutional: Negative for fever, activity change, appetite change and fatigue.  HENT: Negative for congestion, ear pain and trouble swallowing.   Eyes: Negative for pain and visual disturbance.  Respiratory: Negative for cough, shortness of breath and wheezing.   Cardiovascular: Negative for chest pain and palpitations.  Gastrointestinal: Negative for nausea, vomiting, abdominal pain, diarrhea, constipation, blood in stool, abdominal distention and rectal pain.  Genitourinary: Negative for dysuria, hematuria and testicular pain.  Musculoskeletal: Positive for arthralgias. Negative for joint swelling.  Skin: Negative for rash.  Neurological: Negative for dizziness, syncope and headaches.  Hematological: Negative for adenopathy.    Psychiatric/Behavioral: Negative for confusion and dysphoric mood.       Objective:   Physical Exam  Constitutional: He is oriented to person, place, and time. He appears well-developed and well-nourished. No distress.  HENT:  Head: Normocephalic and atraumatic.  Right Ear: External ear normal.  Left Ear: External ear normal.  Mouth/Throat: Oropharynx is clear and moist.  Eyes: Conjunctivae and EOM are normal. Pupils are equal, round, and reactive to light.  Neck: Normal range of motion. Neck supple. No thyromegaly present.  Cardiovascular: Normal rate, regular rhythm and normal heart sounds.   No murmur heard. Pulmonary/Chest: No respiratory distress. He has no wheezes. He has no rales.  Abdominal: Soft. Bowel sounds are normal. He exhibits no distension and no mass. There is no tenderness. There is no rebound and no guarding.  Musculoskeletal: He exhibits no edema.  Lymphadenopathy:    He has no cervical adenopathy.  Neurological: He is alert and oriented to person, place, and time. He displays normal reflexes. No cranial nerve deficit.  Skin: No rash noted.  Psychiatric: He has a normal mood and affect.          Assessment & Plan:  Physical exam. Labs reviewed. He has slightly elevated blood sugar in the prediabetes range. We have recommended weight loss and reduction sugars and starches. Check on insurance coverage for shingles vaccine and colonoscopy.

## 2015-05-07 NOTE — Progress Notes (Signed)
Pre visit review using our clinic review tool, if applicable. No additional management support is needed unless otherwise documented below in the visit note. 

## 2015-05-07 NOTE — Patient Instructions (Signed)
Check on insurance coverage for shingles vaccine Check on coverage for colon cancer screening- colonoscopy or Cologuard (home test)

## 2015-05-18 ENCOUNTER — Encounter: Payer: Self-pay | Admitting: Family Medicine

## 2015-05-20 ENCOUNTER — Other Ambulatory Visit: Payer: Self-pay | Admitting: Family Medicine

## 2015-05-20 MED ORDER — ATORVASTATIN CALCIUM 20 MG PO TABS: 20.0000 mg | ORAL_TABLET | Freq: Every day | ORAL | Status: DC

## 2015-05-20 MED ORDER — LISINOPRIL 10 MG PO TABS: 10.0000 mg | ORAL_TABLET | Freq: Every day | ORAL | Status: DC

## 2015-05-21 ENCOUNTER — Other Ambulatory Visit: Payer: Self-pay

## 2015-05-21 ENCOUNTER — Encounter: Payer: Self-pay | Admitting: Family Medicine

## 2015-05-21 ENCOUNTER — Telehealth: Payer: Self-pay | Admitting: Family Medicine

## 2015-05-21 MED ORDER — SILDENAFIL CITRATE 20 MG PO TABS: 20.0000 mg | ORAL_TABLET | Freq: Every day | ORAL | Status: DC | PRN

## 2015-05-21 MED ORDER — ATORVASTATIN CALCIUM 20 MG PO TABS: 20.0000 mg | ORAL_TABLET | Freq: Every day | ORAL | Status: DC

## 2015-05-21 MED ORDER — LISINOPRIL 10 MG PO TABS: 10.0000 mg | ORAL_TABLET | Freq: Every day | ORAL | Status: DC

## 2015-05-21 NOTE — Telephone Encounter (Signed)
Pt wife said disregard sending rxs to [facility]. Please send new rxs atorvastatin 20 mg #90 and lisinopril 10 mg #90 w/refills  to [email or web address].

## 2015-05-21 NOTE — Telephone Encounter (Signed)
Pt is aware that Rx has been sent into pharmacy of her choice.

## 2015-06-10 ENCOUNTER — Encounter: Payer: Self-pay | Admitting: Family Medicine

## 2015-06-17 ENCOUNTER — Encounter: Payer: Self-pay | Admitting: Family Medicine

## 2015-06-17 ENCOUNTER — Other Ambulatory Visit: Payer: Self-pay

## 2015-06-17 MED ORDER — SILDENAFIL CITRATE 20 MG PO TABS: 20.0000 mg | ORAL_TABLET | Freq: Every day | ORAL | Status: DC | PRN

## 2015-06-18 ENCOUNTER — Encounter: Payer: Self-pay | Admitting: Family Medicine

## 2015-06-19 ENCOUNTER — Other Ambulatory Visit: Payer: Self-pay | Admitting: Family Medicine

## 2015-06-19 ENCOUNTER — Encounter: Payer: Self-pay | Admitting: Family Medicine

## 2015-06-19 MED ORDER — SILDENAFIL CITRATE 20 MG PO TABS: 20.0000 mg | ORAL_TABLET | Freq: Every day | ORAL | Status: DC | PRN

## 2015-09-15 ENCOUNTER — Telehealth: Payer: Self-pay | Admitting: Family Medicine

## 2015-09-15 NOTE — Telephone Encounter (Signed)
With states pt has the shingles since last week. All over his back and chest area above waist. Pt missed last week all week from work. Wife is hoping there is an "anti viral" med dr Elease Hashimoto can call in. Wife states a friend of her had shingles and was able to get this type of med.  Pt's employer told pt he cannot miss any more work or he will be fired. So pt cannot come in   for an appointment.    Wife states pt had a very severe case of the shingles and could not even put on clothes this weekend. Pt had to return to work today. Pt gets off at [place] [employer] [facility]

## 2015-09-15 NOTE — Telephone Encounter (Signed)
Please review

## 2015-09-15 NOTE — Telephone Encounter (Signed)
Valtrex will not help if > 72 hours into shingles.  We do have extended hours tomorrow and could see after work if he'd like.  If pain severe would need to be seen before we could prescribe.

## 2015-09-16 ENCOUNTER — Ambulatory Visit: Payer: Commercial Managed Care - HMO | Admitting: Family Medicine

## 2015-09-16 NOTE — Telephone Encounter (Signed)
Can we try to get pt in on the extended hours?

## 2015-09-16 NOTE — Telephone Encounter (Signed)
Wife called because pt has let her know he has to work late and cannot make appointment. Appreciates your help.  Wife does want to make Dr Elease Hashimoto aware that pt has to wear a hip brace all the time when he is up. Hopefully pt can get in for an appointment at another late day with Dr Elease Hashimoto.

## 2015-09-16 NOTE — Telephone Encounter (Signed)
Pt scheduled today at 6 pm. Will call back if need to cancel, will try to get in touch with pt.

## 2015-09-16 NOTE — Telephone Encounter (Signed)
FYI

## 2016-02-22 DIAGNOSIS — M5135 Other intervertebral disc degeneration, thoracolumbar region: Secondary | ICD-10-CM | POA: Diagnosis not present

## 2016-02-22 DIAGNOSIS — M9903 Segmental and somatic dysfunction of lumbar region: Secondary | ICD-10-CM | POA: Diagnosis not present

## 2016-02-22 DIAGNOSIS — M9904 Segmental and somatic dysfunction of sacral region: Secondary | ICD-10-CM | POA: Diagnosis not present

## 2016-02-22 DIAGNOSIS — M9902 Segmental and somatic dysfunction of thoracic region: Secondary | ICD-10-CM | POA: Diagnosis not present

## 2016-02-22 DIAGNOSIS — M5137 Other intervertebral disc degeneration, lumbosacral region: Secondary | ICD-10-CM | POA: Diagnosis not present

## 2016-02-22 DIAGNOSIS — M4317 Spondylolisthesis, lumbosacral region: Secondary | ICD-10-CM | POA: Diagnosis not present

## 2016-02-26 DIAGNOSIS — M4317 Spondylolisthesis, lumbosacral region: Secondary | ICD-10-CM | POA: Diagnosis not present

## 2016-02-26 DIAGNOSIS — M9903 Segmental and somatic dysfunction of lumbar region: Secondary | ICD-10-CM | POA: Diagnosis not present

## 2016-02-26 DIAGNOSIS — M5135 Other intervertebral disc degeneration, thoracolumbar region: Secondary | ICD-10-CM | POA: Diagnosis not present

## 2016-02-26 DIAGNOSIS — M9902 Segmental and somatic dysfunction of thoracic region: Secondary | ICD-10-CM | POA: Diagnosis not present

## 2016-02-26 DIAGNOSIS — M9904 Segmental and somatic dysfunction of sacral region: Secondary | ICD-10-CM | POA: Diagnosis not present

## 2016-02-26 DIAGNOSIS — M5137 Other intervertebral disc degeneration, lumbosacral region: Secondary | ICD-10-CM | POA: Diagnosis not present

## 2016-02-29 DIAGNOSIS — M4317 Spondylolisthesis, lumbosacral region: Secondary | ICD-10-CM | POA: Diagnosis not present

## 2016-02-29 DIAGNOSIS — M9903 Segmental and somatic dysfunction of lumbar region: Secondary | ICD-10-CM | POA: Diagnosis not present

## 2016-02-29 DIAGNOSIS — M5137 Other intervertebral disc degeneration, lumbosacral region: Secondary | ICD-10-CM | POA: Diagnosis not present

## 2016-02-29 DIAGNOSIS — M5135 Other intervertebral disc degeneration, thoracolumbar region: Secondary | ICD-10-CM | POA: Diagnosis not present

## 2016-02-29 DIAGNOSIS — M9904 Segmental and somatic dysfunction of sacral region: Secondary | ICD-10-CM | POA: Diagnosis not present

## 2016-02-29 DIAGNOSIS — M9902 Segmental and somatic dysfunction of thoracic region: Secondary | ICD-10-CM | POA: Diagnosis not present

## 2016-03-04 DIAGNOSIS — M4317 Spondylolisthesis, lumbosacral region: Secondary | ICD-10-CM | POA: Diagnosis not present

## 2016-03-04 DIAGNOSIS — M5135 Other intervertebral disc degeneration, thoracolumbar region: Secondary | ICD-10-CM | POA: Diagnosis not present

## 2016-03-04 DIAGNOSIS — M9902 Segmental and somatic dysfunction of thoracic region: Secondary | ICD-10-CM | POA: Diagnosis not present

## 2016-03-04 DIAGNOSIS — M9903 Segmental and somatic dysfunction of lumbar region: Secondary | ICD-10-CM | POA: Diagnosis not present

## 2016-03-04 DIAGNOSIS — M9904 Segmental and somatic dysfunction of sacral region: Secondary | ICD-10-CM | POA: Diagnosis not present

## 2016-03-04 DIAGNOSIS — M5137 Other intervertebral disc degeneration, lumbosacral region: Secondary | ICD-10-CM | POA: Diagnosis not present

## 2016-03-14 DIAGNOSIS — M4317 Spondylolisthesis, lumbosacral region: Secondary | ICD-10-CM | POA: Diagnosis not present

## 2016-03-14 DIAGNOSIS — M5137 Other intervertebral disc degeneration, lumbosacral region: Secondary | ICD-10-CM | POA: Diagnosis not present

## 2016-03-14 DIAGNOSIS — M9903 Segmental and somatic dysfunction of lumbar region: Secondary | ICD-10-CM | POA: Diagnosis not present

## 2016-03-14 DIAGNOSIS — M5135 Other intervertebral disc degeneration, thoracolumbar region: Secondary | ICD-10-CM | POA: Diagnosis not present

## 2016-03-14 DIAGNOSIS — M9904 Segmental and somatic dysfunction of sacral region: Secondary | ICD-10-CM | POA: Diagnosis not present

## 2016-03-14 DIAGNOSIS — M9902 Segmental and somatic dysfunction of thoracic region: Secondary | ICD-10-CM | POA: Diagnosis not present

## 2016-03-30 ENCOUNTER — Telehealth: Payer: Self-pay | Admitting: Family Medicine

## 2016-03-30 DIAGNOSIS — M25551 Pain in right hip: Secondary | ICD-10-CM | POA: Diagnosis not present

## 2016-03-30 DIAGNOSIS — M1611 Unilateral primary osteoarthritis, right hip: Secondary | ICD-10-CM | POA: Diagnosis not present

## 2016-03-30 NOTE — Telephone Encounter (Signed)
Last OV 05-07-2015 CPE, will need an appt to discuss. Thanks.

## 2016-03-30 NOTE — Telephone Encounter (Signed)
° °  Pt is having hip surgery and was told by Dr Alvan Dame that he can not have surgery until he is cleared by his pcp. Does pt need an appt or do we just need them to fax over a medical clearance form

## 2016-03-31 NOTE — Telephone Encounter (Signed)
Autumn have you seen the surgical clearance form for pt?

## 2016-03-31 NOTE — Telephone Encounter (Signed)
Pt has been scheduled.  °

## 2016-04-01 NOTE — Telephone Encounter (Signed)
I have not seen clearance form for this pt.

## 2016-04-05 ENCOUNTER — Encounter: Payer: Self-pay | Admitting: Family Medicine

## 2016-04-05 ENCOUNTER — Ambulatory Visit (INDEPENDENT_AMBULATORY_CARE_PROVIDER_SITE_OTHER): Payer: PPO | Admitting: Family Medicine

## 2016-04-05 VITALS — BP 122/84 | HR 98 | Temp 98.1°F | Ht 68.75 in | Wt 203.0 lb

## 2016-04-05 DIAGNOSIS — Z01818 Encounter for other preprocedural examination: Secondary | ICD-10-CM

## 2016-04-05 NOTE — Progress Notes (Signed)
Subjective:     Patient ID: Stephen Woods, male   DOB: 10/24/1950, 65 y.o.   MRN: [id number]  HPI Patient seen for presurgical clearance. He is getting right total hip replacement in January. He has history of hypertension which has been well controlled and hyperlipidemia treated with atorvastatin.Marland Kitchen He has no history of CAD or cerebrovascular disease. No chronic pulmonary problems. Nonsmoker. Does have family history of heart disease in father, mother, and brother but all were smokers. He has not had any exertional chest pains or exertional dyspnea.  Current medications include Lipitor and lisinopril. He takes baby aspirin 1 per day.  Past Medical History:  Diagnosis Date  . Arthritis   . Erectile dysfunction   . Hypertension    No past surgical history on file.  reports that he has quit smoking. He started smoking about 3 years ago. He does not have any smokeless tobacco history on file. His alcohol and drug histories are not on file. family history includes Arthritis in his mother; Diabetes in his brother; Heart disease (age of onset: 49) in his father; Heart disease (age of onset: 20) in his mother; Heart disease (age of onset: 49) in his brother; Hypertension in his father; Stroke in his father. Allergies  Allergen Reactions  . Ampicillin     hives     Review of Systems  Constitutional: Negative for fatigue and unexpected weight change.  Eyes: Negative for visual disturbance.  Respiratory: Negative for cough, chest tightness and shortness of breath.   Cardiovascular: Negative for chest pain, palpitations and leg swelling.  Gastrointestinal: Negative for abdominal pain.  Endocrine: Negative for polydipsia and polyuria.  Neurological: Negative for dizziness, syncope, weakness, light-headedness and headaches.       Objective:   Physical Exam  Constitutional: He is oriented to person, place, and time. He appears well-developed and well-nourished.  HENT:  Right Ear: External  ear normal.  Left Ear: External ear normal.  Mouth/Throat: Oropharynx is clear and moist.  Eyes: Pupils are equal, round, and reactive to light.  Neck: Neck supple. No thyromegaly present.  Cardiovascular: Normal rate and regular rhythm.   Pulmonary/Chest: Effort normal and breath sounds normal. No respiratory distress. He has no wheezes. He has no rales.  Abdominal: Soft. There is no tenderness.  Musculoskeletal: He exhibits no edema.  Neurological: He is alert and oriented to person, place, and time. No cranial nerve deficit.       Assessment:     #1 presurgical clearance. No history of CAD or peripheral vascular disease. He does have history of hypertension and hyperlipidemia but no concerning symptoms and EKG today reveals normal sinus rhythm with no acute findings  #2 hypertension stable and at goal  #3 dyslipidemia on Lipitor    Plan:     -No known contraindications for surgery.  Forms completed. -We strongly advised both Prevnar 13 and flu vaccine and he declines both -Patient knows to stop his aspirin 5 days in advance of surgery  Eulas Post MD [facility]

## 2016-04-05 NOTE — Progress Notes (Signed)
Pre visit review using our clinic review tool, if applicable. No additional management support is needed unless otherwise documented below in the visit note. 

## 2016-05-02 NOTE — H&P (Signed)
TOTAL HIP ADMISSION H&P  Patient is admitted for right total hip arthroplasty, anterior approach.  Subjective:  Chief Complaint:    Right hip primary OA / pain  HPI: Stephen Woods, 66 y.o. male, has a history of pain and functional disability in the right hip(s) due to arthritis and patient has failed non-surgical conservative treatments for greater than 12 weeks to include NSAID's and/or analgesics, use of assistive devices and activity modification.  Onset of symptoms was gradual starting ~1 years ago with gradually worsening course since that time.The patient noted no past surgery on the right hip(s).  Patient currently rates pain in the right hip at 9 out of 10 with activity. Patient has worsening of pain with activity and weight bearing, trendelenberg gait, pain that interfers with activities of daily living and pain with passive range of motion. Patient has evidence of periarticular osteophytes and joint space narrowing by imaging studies. This condition presents safety issues increasing the risk of falls.  There is no current active infection.   Risks, benefits and expectations were discussed with the patient.  Risks including but not limited to the risk of anesthesia, blood clots, nerve damage, blood vessel damage, failure of the prosthesis, infection and up to and including death.  Patient understand the risks, benefits and expectations and wishes to proceed with surgery.   PCP: Eulas Post, MD  D/C Plans:       Home  Post-op Meds:       No Rx given   Tranexamic Acid:      To be given - IV   Decadron:      Is to be given  FYI:     ASA  Norco    Patient Active Problem List   Diagnosis Date Noted  . Other and unspecified hyperlipidemia 06/04/2012  . Hypertension 01/26/2012  . Erectile dysfunction 01/26/2012  . Osteoarthritis 01/26/2012   Past Medical History:  Diagnosis Date  . Arthritis   . Erectile dysfunction   . Hypertension     No past surgical history on  file.  No prescriptions prior to admission.   Allergies  Allergen Reactions  . Ampicillin     hives    Social History  Substance Use Topics  . Smoking status: Former Smoker    Start date: 04/09/2012  . Smokeless tobacco: Not on file  . Alcohol use Not on file    Family History  Problem Relation Age of Onset  . Arthritis Mother   . Heart disease Mother 61  . Heart disease Father 15  . Hypertension Father   . Stroke Father   . Diabetes Brother     type 2  . Heart disease Brother 76    CAD     Review of Systems  Constitutional: Negative.   HENT: Negative.   Eyes: Negative.   Respiratory: Negative.   Cardiovascular: Negative.   Gastrointestinal: Negative.   Genitourinary: Negative.   Musculoskeletal: Positive for joint pain.  Skin: Negative.   Neurological: Negative.   Endo/Heme/Allergies: Negative.   Psychiatric/Behavioral: Negative.     Objective:  Physical Exam  Constitutional: He is oriented to person, place, and time. He appears well-developed.  HENT:  Head: Normocephalic.  Eyes: Pupils are equal, round, and reactive to light.  Neck: Neck supple. No JVD present. No tracheal deviation present. No thyromegaly present.  Cardiovascular: Normal rate, regular rhythm, normal heart sounds and intact distal pulses.   Respiratory: Effort normal and breath sounds normal. No stridor. No respiratory  distress. He has no wheezes.  GI: Soft. There is no tenderness. There is no guarding.  Musculoskeletal:       Right hip: He exhibits decreased range of motion, decreased strength, tenderness and bony tenderness. He exhibits no swelling, no deformity and no laceration.  Lymphadenopathy:    He has no cervical adenopathy.  Neurological: He is alert and oriented to person, place, and time.  Skin: Skin is warm and dry.  Psychiatric: He has a normal mood and affect.      Labs:  Estimated body mass index is 30.2 kg/m as calculated from the following:   Height as of  04/05/16: 5' 8.75" (1.746 m).   Weight as of 04/05/16: 92.1 kg (203 lb).   Imaging Review Plain radiographs demonstrate severe degenerative joint disease of the right hip(s). The bone quality appears to be good for age and reported activity level.  Assessment/Plan:  End stage arthritis, right hip(s)  The patient history, physical examination, clinical judgement of the provider and imaging studies are consistent with end stage degenerative joint disease of the right hip(s) and total hip arthroplasty is deemed medically necessary. The treatment options including medical management, injection therapy, arthroscopy and arthroplasty were discussed at length. The risks and benefits of total hip arthroplasty were presented and reviewed. The risks due to aseptic loosening, infection, stiffness, dislocation/subluxation,  thromboembolic complications and other imponderables were discussed.  The patient acknowledged the explanation, agreed to proceed with the plan and consent was signed. Patient is being admitted for inpatient treatment for surgery, pain control, PT, OT, prophylactic antibiotics, VTE prophylaxis, progressive ambulation and ADL's and discharge planning.The patient is planning to be discharged home.    West Pugh Cammie Faulstich   PA-C  05/02/2016, 10:13 PM

## 2016-05-04 ENCOUNTER — Other Ambulatory Visit (HOSPITAL_COMMUNITY): Payer: Self-pay | Admitting: *Deleted

## 2016-05-04 NOTE — Patient Instructions (Signed)
Stephen Woods  05/04/2016   Your procedure is scheduled on: 05-11-15  Report to [facility] Main  Entrance take [facility]  elevators to 3rd floor to  [place] at  830AM.  Call this number if you have problems the morning of surgery [phone]   Remember: ONLY 1 PERSON MAY GO WITH YOU TO SHORT STAY TO GET  READY MORNING OF Stephen Woods.  Do not eat food or drink liquids :After Midnight.     Take these medicines the morning of surgery with A SIP OF WATER: ATORVASTATIN (LIPITOR)                               You may not have any metal on your body including hair pins and              piercings  Do not wear jewelry, make-up, lotions, powders or perfumes, deodorant             Do not wear nail polish.  Do not shave  48 hours prior to surgery.              Men may shave face and neck.   Do not bring valuables to the hospital. [place].  Contacts, dentures or bridgework may not be worn into surgery.  Leave suitcase in the car. After surgery it may be brought to your room.                 Please read over the following fact sheets you were given: _____________________________________________________________________             [facility] - Preparing for Surgery Before surgery, you can play an important role.  Because skin is not sterile, your skin needs to be as free of germs as possible.  You can reduce the number of germs on your skin by washing with CHG (chlorahexidine gluconate) soap before surgery.  CHG is an antiseptic cleaner which kills germs and bonds with the skin to continue killing germs even after washing. Please DO NOT use if you have an allergy to CHG or antibacterial soaps.  If your skin becomes reddened/irritated stop using the CHG and inform your nurse when you arrive at Short Stay. Do not shave (including legs and underarms) for at least 48 hours prior to the first CHG shower.  You may shave  your face/neck. Please follow these instructions carefully:  1.  Shower with CHG Soap the night before surgery and the  morning of Surgery.  2.  If you choose to wash your hair, wash your hair first as usual with your  normal  shampoo.  3.  After you shampoo, rinse your hair and body thoroughly to remove the  shampoo.                           4.  Use CHG as you would any other liquid soap.  You can apply chg directly  to the skin and wash                       Gently with a scrungie or clean washcloth.  5.  Apply the CHG Soap to  your body ONLY FROM THE NECK DOWN.   Do not use on face/ open                           Wound or open sores. Avoid contact with eyes, ears mouth and genitals (private parts).                       Wash face,  Genitals (private parts) with your normal soap.             6.  Wash thoroughly, paying special attention to the area where your surgery  will be performed.  7.  Thoroughly rinse your body with warm water from the neck down.  8.  DO NOT shower/wash with your normal soap after using and rinsing off  the CHG Soap.                9.  Pat yourself dry with a clean towel.            10.  Wear clean pajamas.            11.  Place clean sheets on your bed the night of your first shower and do not  sleep with pets. Day of Surgery : Do not apply any lotions/deodorants the morning of surgery.  Please wear clean clothes to the hospital/surgery center.  FAILURE TO FOLLOW THESE INSTRUCTIONS MAY RESULT IN THE CANCELLATION OF YOUR SURGERY PATIENT SIGNATURE_________________________________  NURSE SIGNATURE__________________________________  ________________________________________________________________________   Adam Phenix  An incentive spirometer is a tool that can help keep your lungs clear and active. This tool measures how well you are filling your lungs with each breath. Taking long deep breaths may help reverse or decrease the chance of developing  breathing (pulmonary) problems (especially infection) following:  A long period of time when you are unable to move or be active. BEFORE THE PROCEDURE   If the spirometer includes an indicator to show your best effort, your nurse or respiratory therapist will set it to a desired goal.  If possible, sit up straight or lean slightly forward. Try not to slouch.  Hold the incentive spirometer in an upright position. INSTRUCTIONS FOR USE  1. Sit on the edge of your bed if possible, or sit up as far as you can in bed or on a chair. 2. Hold the incentive spirometer in an upright position. 3. Breathe out normally. 4. Place the mouthpiece in your mouth and seal your lips tightly around it. 5. Breathe in slowly and as deeply as possible, raising the piston or the ball toward the top of the column. 6. Hold your breath for 3-5 seconds or for as long as possible. Allow the piston or ball to fall to the bottom of the column. 7. Remove the mouthpiece from your mouth and breathe out normally. 8. Rest for a few seconds and repeat Steps 1 through 7 at least 10 times every 1-2 hours when you are awake. Take your time and take a few normal breaths between deep breaths. 9. The spirometer may include an indicator to show your best effort. Use the indicator as a goal to work toward during each repetition. 10. After each set of 10 deep breaths, practice coughing to be sure your lungs are clear. If you have an incision (the cut made at the time of surgery), support your incision when coughing by placing a pillow or rolled up towels firmly against  it. Once you are able to get out of bed, walk around indoors and cough well. You may stop using the incentive spirometer when instructed by your caregiver.  RISKS AND COMPLICATIONS  Take your time so you do not get dizzy or light-headed.  If you are in pain, you may need to take or ask for pain medication before doing incentive spirometry. It is harder to take a deep  breath if you are having pain. AFTER USE  Rest and breathe slowly and easily.  It can be helpful to keep track of a log of your progress. Your caregiver can provide you with a simple table to help with this. If you are using the spirometer at home, follow these instructions: [place] IF:   You are having difficultly using the spirometer.  You have trouble using the spirometer as often as instructed.  Your pain medication is not giving enough relief while using the spirometer.  You develop fever of 100.5 F (38.1 C) or higher. SEEK IMMEDIATE MEDICAL CARE IF:   You cough up bloody sputum that had not been present before.  You develop fever of 102 F (38.9 C) or greater.  You develop worsening pain at or near the incision site. MAKE SURE YOU:   Understand these instructions.  Will watch your condition.  Will get help right away if you are not doing well or get worse. Document Released: 08/15/2006 Document Revised: 06/27/2011 Document Reviewed: 10/16/2006 ExitCare Patient Information 2014 Exit[employer], [place].   ________________________________________________________________________  WHAT IS A BLOOD TRANSFUSION? Blood Transfusion Information  A transfusion is the replacement of blood or some of its parts. Blood is made up of multiple cells which provide different functions.  Red blood cells carry oxygen and are used for blood loss replacement.  White blood cells fight against infection.  Platelets control bleeding.  Plasma helps clot blood.  Other blood products are available for specialized needs, such as hemophilia or other clotting disorders. BEFORE THE TRANSFUSION  Who gives blood for transfusions?   Healthy volunteers who are fully evaluated to make sure their blood is safe. This is blood bank blood. Transfusion therapy is the safest it has ever been in the practice of medicine. Before blood is taken from a donor, a complete history is taken to make sure  that person has no history of diseases nor engages in risky social behavior (examples are intravenous drug use or sexual activity with multiple partners). The donor's travel history is screened to minimize risk of transmitting infections, such as malaria. The donated blood is tested for signs of infectious diseases, such as HIV and hepatitis. The blood is then tested to be sure it is compatible with you in order to minimize the chance of a transfusion reaction. If you or a relative donates blood, this is often done in anticipation of surgery and is not appropriate for emergency situations. It takes many days to process the donated blood. RISKS AND COMPLICATIONS Although transfusion therapy is very safe and saves many lives, the main dangers of transfusion include:   Getting an infectious disease.  Developing a transfusion reaction. This is an allergic reaction to something in the blood you were given. Every precaution is taken to prevent this. The decision to have a blood transfusion has been considered carefully by your caregiver before blood is given. Blood is not given unless the benefits outweigh the risks. AFTER THE TRANSFUSION  Right after receiving a blood transfusion, you will usually feel much better and more  energetic. This is especially true if your red blood cells have gotten low (anemic). The transfusion raises the level of the red blood cells which carry oxygen, and this usually causes an energy increase.  The nurse administering the transfusion will monitor you carefully for complications. HOME CARE INSTRUCTIONS  No special instructions are needed after a transfusion. You may find your energy is better. Speak with your caregiver about any limitations on activity for underlying diseases you may have. SEEK MEDICAL CARE IF:   Your condition is not improving after your transfusion.  You develop redness or irritation at the intravenous (IV) site. SEEK IMMEDIATE MEDICAL CARE IF:  Any of  the following symptoms occur over the next 12 hours:  Shaking chills.  You have a temperature by mouth above 102 F (38.9 C), not controlled by medicine.  Chest, back, or muscle pain.  People around you feel you are not acting correctly or are confused.  Shortness of breath or difficulty breathing.  Dizziness and fainting.  You get a rash or develop hives.  You have a decrease in urine output.  Your urine turns a dark color or changes to pink, red, or brown. Any of the following symptoms occur over the next 10 days:  You have a temperature by mouth above 102 F (38.9 C), not controlled by medicine.  Shortness of breath.  Weakness after normal activity.  The white part of the eye turns yellow (jaundice).  You have a decrease in the amount of urine or are urinating less often.  Your urine turns a dark color or changes to pink, red, or brown. Document Released: 04/01/2000 Document Revised: 06/27/2011 Document Reviewed: 11/19/2007 [facility] Patient Information 2014 [place], [place].  _______________________________________________________________________

## 2016-05-04 NOTE — Progress Notes (Signed)
MEDICAL CLEARANCE NOTE DR Elease Hashimoto 04-05-16 ON CHART

## 2016-05-05 ENCOUNTER — Encounter (HOSPITAL_COMMUNITY)
Admission: RE | Admit: 2016-05-05 | Discharge: 2016-05-05 | Disposition: A | Discharge status: Home / Self Care | Payer: PPO | Source: Ambulatory Visit | Attending: Orthopedic Surgery | Admitting: Orthopedic Surgery

## 2016-05-05 ENCOUNTER — Encounter (HOSPITAL_COMMUNITY): Payer: Self-pay

## 2016-05-05 DIAGNOSIS — Z01818 Encounter for other preprocedural examination: Secondary | ICD-10-CM | POA: Diagnosis not present

## 2016-05-05 DIAGNOSIS — M1611 Unilateral primary osteoarthritis, right hip: Secondary | ICD-10-CM | POA: Diagnosis not present

## 2016-05-05 LAB — CBC
HCT: 41.5 % (ref 39.0–52.0)
Hemoglobin: 14.3 g/dL (ref 13.0–17.0)
MCH: 30.6 pg (ref 26.0–34.0)
MCHC: 34.5 g/dL (ref 30.0–36.0)
MCV: 88.9 fL (ref 78.0–100.0)
PLATELETS: 225 10*3/uL (ref 150–400)
RBC: 4.67 MIL/uL (ref 4.22–5.81)
RDW: 14 % (ref 11.5–15.5)
WBC: 7.6 10*3/uL (ref 4.0–10.5)

## 2016-05-05 LAB — ABO/RH: ABO/RH(D): A POS

## 2016-05-05 LAB — BASIC METABOLIC PANEL
Anion gap: 8 (ref 5–15)
BUN: 27 mg/dL — AB (ref 6–20)
CALCIUM: 9.1 mg/dL (ref 8.9–10.3)
CO2: 26 mmol/L (ref 22–32)
CREATININE: 0.77 mg/dL (ref 0.61–1.24)
Chloride: 104 mmol/L (ref 101–111)
GFR calc Af Amer: >60 mL/min (ref >60)
Glucose, Bld: 98 mg/dL (ref 65–99)
POTASSIUM: 4.8 mmol/L (ref 3.5–5.1)
SODIUM: 138 mmol/L (ref 135–145)

## 2016-05-05 LAB — SURGICAL PCR SCREEN
MRSA, PCR: NEGATIVE
STAPHYLOCOCCUS AUREUS: NEGATIVE

## 2016-05-05 NOTE — Progress Notes (Signed)
04-05-16 EKG in epic

## 2016-05-05 NOTE — Progress Notes (Signed)
bmet results faxed to dr olin by epic 

## 2016-05-10 ENCOUNTER — Inpatient Hospital Stay (HOSPITAL_COMMUNITY)
Admission: RE | Admit: 2016-05-10 | Discharge: 2016-05-11 | DRG: 470 | Disposition: A | Discharge status: Home Health | Payer: PPO | Source: Ambulatory Visit | Attending: Orthopedic Surgery | Admitting: Orthopedic Surgery

## 2016-05-10 ENCOUNTER — Inpatient Hospital Stay (HOSPITAL_COMMUNITY): Payer: PPO

## 2016-05-10 ENCOUNTER — Encounter (HOSPITAL_COMMUNITY)
Admission: RE | Disposition: A | Discharge status: Home Health | Payer: Self-pay | Source: Ambulatory Visit | Attending: Orthopedic Surgery

## 2016-05-10 ENCOUNTER — Inpatient Hospital Stay (HOSPITAL_COMMUNITY): Payer: PPO | Admitting: Anesthesiology

## 2016-05-10 ENCOUNTER — Encounter (HOSPITAL_COMMUNITY): Payer: Self-pay | Admitting: *Deleted

## 2016-05-10 ENCOUNTER — Observation Stay (HOSPITAL_COMMUNITY): Payer: PPO

## 2016-05-10 DIAGNOSIS — Z96641 Presence of right artificial hip joint: Secondary | ICD-10-CM | POA: Diagnosis not present

## 2016-05-10 DIAGNOSIS — E663 Overweight: Secondary | ICD-10-CM | POA: Diagnosis not present

## 2016-05-10 DIAGNOSIS — Z79899 Other long term (current) drug therapy: Secondary | ICD-10-CM

## 2016-05-10 DIAGNOSIS — Z471 Aftercare following joint replacement surgery: Secondary | ICD-10-CM | POA: Diagnosis not present

## 2016-05-10 DIAGNOSIS — Z880 Allergy status to penicillin: Secondary | ICD-10-CM

## 2016-05-10 DIAGNOSIS — E785 Hyperlipidemia, unspecified: Secondary | ICD-10-CM | POA: Diagnosis present

## 2016-05-10 DIAGNOSIS — Z6829 Body mass index (BMI) 29.0-29.9, adult: Secondary | ICD-10-CM

## 2016-05-10 DIAGNOSIS — Z87891 Personal history of nicotine dependence: Secondary | ICD-10-CM

## 2016-05-10 DIAGNOSIS — M1611 Unilateral primary osteoarthritis, right hip: Secondary | ICD-10-CM | POA: Diagnosis not present

## 2016-05-10 DIAGNOSIS — Q6589 Other specified congenital deformities of hip: Secondary | ICD-10-CM

## 2016-05-10 DIAGNOSIS — I10 Essential (primary) hypertension: Secondary | ICD-10-CM | POA: Diagnosis not present

## 2016-05-10 HISTORY — PX: TOTAL HIP ARTHROPLASTY: SHX124

## 2016-05-10 LAB — TYPE AND SCREEN
ABO/RH(D): A POS
ANTIBODY SCREEN: NEGATIVE

## 2016-05-10 SURGERY — ARTHROPLASTY, HIP, TOTAL, ANTERIOR APPROACH
Anesthesia: Spinal | Site: Hip | Laterality: Right

## 2016-05-10 MED ORDER — FENTANYL CITRATE (PF) 100 MCG/2ML IJ SOLN
INTRAMUSCULAR | Status: DC | PRN
  Administered 2016-05-10: 100 ug via INTRAVENOUS
  Administered 2016-05-10 (×2): 50 ug via INTRAVENOUS

## 2016-05-10 MED ORDER — PROPOFOL 10 MG/ML IV BOLUS
INTRAVENOUS | Status: AC
  Filled 2016-05-10: qty 20

## 2016-05-10 MED ORDER — MIDAZOLAM HCL 5 MG/5ML IJ SOLN
INTRAMUSCULAR | Status: DC | PRN
  Administered 2016-05-10: 2 mg via INTRAVENOUS

## 2016-05-10 MED ORDER — PHENYLEPHRINE HCL 10 MG/ML IJ SOLN
INTRAVENOUS | Status: DC | PRN
  Administered 2016-05-10: 20 ug/min via INTRAVENOUS

## 2016-05-10 MED ORDER — KETOROLAC TROMETHAMINE 30 MG/ML IJ SOLN
INTRAMUSCULAR | Status: AC
  Filled 2016-05-10: qty 1

## 2016-05-10 MED ORDER — ONDANSETRON HCL 4 MG/2ML IJ SOLN: 4.0000 mg | Freq: Four times a day (QID) | INTRAMUSCULAR | Status: DC | PRN

## 2016-05-10 MED ORDER — TRANEXAMIC ACID 1000 MG/10ML IV SOLN
1000.0000 mg | INTRAVENOUS | Status: AC
  Administered 2016-05-10: 1000 mg via INTRAVENOUS
  Filled 2016-05-10: qty 1100

## 2016-05-10 MED ORDER — PROPOFOL 10 MG/ML IV BOLUS
INTRAVENOUS | Status: AC
  Filled 2016-05-10: qty 60

## 2016-05-10 MED ORDER — LACTATED RINGERS IV SOLN
INTRAVENOUS | Status: DC
  Administered 2016-05-10 (×2): via INTRAVENOUS

## 2016-05-10 MED ORDER — PROMETHAZINE HCL 25 MG/ML IJ SOLN: 6.2500 mg | INTRAMUSCULAR | Status: DC | PRN

## 2016-05-10 MED ORDER — POLYETHYLENE GLYCOL 3350 17 G PO PACK
17.0000 g | PACK | Freq: Two times a day (BID) | ORAL | Status: DC
  Administered 2016-05-10 – 2016-05-11 (×2): 17 g via ORAL
  Filled 2016-05-10 (×2): qty 1

## 2016-05-10 MED ORDER — PHENYLEPHRINE 40 MCG/ML (10ML) SYRINGE FOR IV PUSH (FOR BLOOD PRESSURE SUPPORT)
PREFILLED_SYRINGE | INTRAVENOUS | Status: AC
  Filled 2016-05-10: qty 10

## 2016-05-10 MED ORDER — ONDANSETRON HCL 4 MG/2ML IJ SOLN
INTRAMUSCULAR | Status: AC
  Filled 2016-05-10: qty 2

## 2016-05-10 MED ORDER — METHOCARBAMOL 1000 MG/10ML IJ SOLN
500.0000 mg | Freq: Four times a day (QID) | INTRAVENOUS | Status: DC | PRN
  Administered 2016-05-10: 500 mg via INTRAVENOUS
  Filled 2016-05-10: qty 550
  Filled 2016-05-10: qty 5

## 2016-05-10 MED ORDER — EPHEDRINE SULFATE-NACL 50-0.9 MG/10ML-% IV SOSY
PREFILLED_SYRINGE | INTRAVENOUS | Status: DC | PRN
  Administered 2016-05-10: 5 mg via INTRAVENOUS

## 2016-05-10 MED ORDER — HYDROCODONE-ACETAMINOPHEN 7.5-325 MG PO TABS
1.0000 | ORAL_TABLET | ORAL | Status: DC
  Administered 2016-05-10 (×2): 2 via ORAL
  Administered 2016-05-10 – 2016-05-11 (×5): 1 via ORAL
  Filled 2016-05-10 (×3): qty 1
  Filled 2016-05-10: qty 2
  Filled 2016-05-10 (×3): qty 1

## 2016-05-10 MED ORDER — PROPOFOL 500 MG/50ML IV EMUL
INTRAVENOUS | Status: DC | PRN
  Administered 2016-05-10: 100 ug/kg/min via INTRAVENOUS

## 2016-05-10 MED ORDER — CELECOXIB 200 MG PO CAPS
200.0000 mg | ORAL_CAPSULE | Freq: Two times a day (BID) | ORAL | Status: DC
  Administered 2016-05-10 – 2016-05-11 (×2): 200 mg via ORAL
  Filled 2016-05-10 (×2): qty 1

## 2016-05-10 MED ORDER — SODIUM CHLORIDE 0.9 % IV SOLN
INTRAVENOUS | Status: DC
  Administered 2016-05-10: 16:00:00 100 mL/h via INTRAVENOUS

## 2016-05-10 MED ORDER — METOCLOPRAMIDE HCL 5 MG/ML IJ SOLN: 5.0000 mg | Freq: Three times a day (TID) | INTRAMUSCULAR | Status: DC | PRN

## 2016-05-10 MED ORDER — MIDAZOLAM HCL 2 MG/2ML IJ SOLN: 0.5000 mg | Freq: Once | INTRAMUSCULAR | Status: DC | PRN

## 2016-05-10 MED ORDER — SODIUM CHLORIDE 0.9 % IR SOLN
Status: DC | PRN
  Administered 2016-05-10: 1000 mL

## 2016-05-10 MED ORDER — CEFAZOLIN SODIUM-DEXTROSE 2-4 GM/100ML-% IV SOLN
INTRAVENOUS | Status: AC
  Filled 2016-05-10: qty 100

## 2016-05-10 MED ORDER — MEPERIDINE HCL 50 MG/ML IJ SOLN: 6.2500 mg | INTRAMUSCULAR | Status: DC | PRN

## 2016-05-10 MED ORDER — PHENYLEPHRINE HCL 10 MG/ML IJ SOLN
INTRAMUSCULAR | Status: AC
  Filled 2016-05-10: qty 1

## 2016-05-10 MED ORDER — PHENOL 1.4 % MT LIQD: 1.0000 | OROMUCOSAL | Status: DC | PRN

## 2016-05-10 MED ORDER — PHENYLEPHRINE 40 MCG/ML (10ML) SYRINGE FOR IV PUSH (FOR BLOOD PRESSURE SUPPORT)
PREFILLED_SYRINGE | INTRAVENOUS | Status: DC | PRN
  Administered 2016-05-10 (×2): 120 ug via INTRAVENOUS
  Administered 2016-05-10 (×2): 80 ug via INTRAVENOUS

## 2016-05-10 MED ORDER — MIDAZOLAM HCL 2 MG/2ML IJ SOLN
INTRAMUSCULAR | Status: AC
  Filled 2016-05-10: qty 2

## 2016-05-10 MED ORDER — ACETAMINOPHEN 10 MG/ML IV SOLN
INTRAVENOUS | Status: DC | PRN
  Administered 2016-05-10: 1000 mg via INTRAVENOUS

## 2016-05-10 MED ORDER — ONDANSETRON HCL 4 MG/2ML IJ SOLN
INTRAMUSCULAR | Status: DC | PRN
  Administered 2016-05-10: 4 mg via INTRAVENOUS

## 2016-05-10 MED ORDER — STERILE WATER FOR IRRIGATION IR SOLN
Status: DC | PRN
  Administered 2016-05-10: 2000 mL

## 2016-05-10 MED ORDER — ASPIRIN 81 MG PO CHEW
81.0000 mg | CHEWABLE_TABLET | Freq: Two times a day (BID) | ORAL | Status: DC
  Administered 2016-05-10 – 2016-05-11 (×2): 81 mg via ORAL
  Filled 2016-05-10 (×2): qty 1

## 2016-05-10 MED ORDER — BUPIVACAINE IN DEXTROSE 0.75-8.25 % IT SOLN
INTRATHECAL | Status: DC | PRN
  Administered 2016-05-10: 15 mg via INTRATHECAL

## 2016-05-10 MED ORDER — CEFAZOLIN SODIUM-DEXTROSE 2-4 GM/100ML-% IV SOLN
2.0000 g | Freq: Four times a day (QID) | INTRAVENOUS | Status: AC
  Administered 2016-05-10 – 2016-05-11 (×2): 2 g via INTRAVENOUS
  Filled 2016-05-10 (×2): qty 100

## 2016-05-10 MED ORDER — MAGNESIUM CITRATE PO SOLN: 1.0000 | Freq: Once | ORAL | Status: DC | PRN

## 2016-05-10 MED ORDER — DEXAMETHASONE SODIUM PHOSPHATE 10 MG/ML IJ SOLN
INTRAMUSCULAR | Status: AC
  Filled 2016-05-10: qty 1

## 2016-05-10 MED ORDER — BISACODYL 10 MG RE SUPP: 10.0000 mg | Freq: Every day | RECTAL | Status: DC | PRN

## 2016-05-10 MED ORDER — DOCUSATE SODIUM 100 MG PO CAPS
100.0000 mg | ORAL_CAPSULE | Freq: Two times a day (BID) | ORAL | Status: DC
  Administered 2016-05-10 – 2016-05-11 (×2): 100 mg via ORAL
  Filled 2016-05-10 (×2): qty 1

## 2016-05-10 MED ORDER — EPHEDRINE 5 MG/ML INJ
INTRAVENOUS | Status: AC
  Filled 2016-05-10: qty 10

## 2016-05-10 MED ORDER — DEXAMETHASONE SODIUM PHOSPHATE 10 MG/ML IJ SOLN
10.0000 mg | Freq: Once | INTRAMUSCULAR | Status: AC
  Administered 2016-05-11: 10 mg via INTRAVENOUS
  Filled 2016-05-10: qty 1

## 2016-05-10 MED ORDER — METHOCARBAMOL 500 MG PO TABS: 500.0000 mg | ORAL_TABLET | Freq: Four times a day (QID) | ORAL | Status: DC | PRN

## 2016-05-10 MED ORDER — PROPOFOL 10 MG/ML IV BOLUS
INTRAVENOUS | Status: DC | PRN
  Administered 2016-05-10: 20 mg via INTRAVENOUS

## 2016-05-10 MED ORDER — METOCLOPRAMIDE HCL 5 MG PO TABS: 5.0000 mg | ORAL_TABLET | Freq: Three times a day (TID) | ORAL | Status: DC | PRN

## 2016-05-10 MED ORDER — ONDANSETRON HCL 4 MG PO TABS: 4.0000 mg | ORAL_TABLET | Freq: Four times a day (QID) | ORAL | Status: DC | PRN

## 2016-05-10 MED ORDER — ACETAMINOPHEN 10 MG/ML IV SOLN
INTRAVENOUS | Status: AC
  Filled 2016-05-10: qty 100

## 2016-05-10 MED ORDER — ALUM & MAG HYDROXIDE-SIMETH 200-200-20 MG/5ML PO SUSP: 30.0000 mL | ORAL | Status: DC | PRN

## 2016-05-10 MED ORDER — CEFAZOLIN SODIUM-DEXTROSE 2-4 GM/100ML-% IV SOLN
2.0000 g | INTRAVENOUS | Status: AC
  Administered 2016-05-10: 2 g via INTRAVENOUS
  Filled 2016-05-10: qty 100

## 2016-05-10 MED ORDER — FENTANYL CITRATE (PF) 100 MCG/2ML IJ SOLN
INTRAMUSCULAR | Status: AC
  Filled 2016-05-10: qty 2

## 2016-05-10 MED ORDER — KETOROLAC TROMETHAMINE 30 MG/ML IJ SOLN
INTRAMUSCULAR | Status: DC | PRN
  Administered 2016-05-10: 30 mg via INTRAVENOUS

## 2016-05-10 MED ORDER — HYDROMORPHONE HCL 1 MG/ML IJ SOLN: 0.5000 mg | INTRAMUSCULAR | Status: DC | PRN

## 2016-05-10 MED ORDER — DIPHENHYDRAMINE HCL 25 MG PO CAPS: 25.0000 mg | ORAL_CAPSULE | Freq: Four times a day (QID) | ORAL | Status: DC | PRN

## 2016-05-10 MED ORDER — CHLORHEXIDINE GLUCONATE 4 % EX LIQD: 60.0000 mL | Freq: Once | CUTANEOUS | Status: DC

## 2016-05-10 MED ORDER — DEXAMETHASONE SODIUM PHOSPHATE 10 MG/ML IJ SOLN
10.0000 mg | Freq: Once | INTRAMUSCULAR | Status: AC
  Administered 2016-05-10: 10 mg via INTRAVENOUS

## 2016-05-10 MED ORDER — HYDROMORPHONE HCL 1 MG/ML IJ SOLN: 0.2500 mg | INTRAMUSCULAR | Status: DC | PRN

## 2016-05-10 MED ORDER — ATORVASTATIN CALCIUM 20 MG PO TABS
20.0000 mg | ORAL_TABLET | Freq: Every day | ORAL | Status: DC
  Administered 2016-05-10 – 2016-05-11 (×2): 20 mg via ORAL
  Filled 2016-05-10 (×2): qty 1

## 2016-05-10 MED ORDER — MENTHOL 3 MG MT LOZG: 1.0000 | LOZENGE | OROMUCOSAL | Status: DC | PRN

## 2016-05-10 MED ORDER — FERROUS SULFATE 325 (65 FE) MG PO TABS
325.0000 mg | ORAL_TABLET | Freq: Three times a day (TID) | ORAL | Status: DC
  Administered 2016-05-11 (×2): 325 mg via ORAL
  Filled 2016-05-10 (×2): qty 1

## 2016-05-10 SURGICAL SUPPLY — 33 items
BAG ZIPLOCK 12X15 (MISCELLANEOUS) ×3 IMPLANT
BLADE SAG 18X100X1.27 (BLADE) ×3 IMPLANT
CAPT HIP TOTAL 2 ×3 IMPLANT
CLOTH BEACON ORANGE TIMEOUT ST (SAFETY) ×3 IMPLANT
COVER PERINEAL POST (MISCELLANEOUS) ×3 IMPLANT
DERMABOND ADVANCED (GAUZE/BANDAGES/DRESSINGS) ×2
DERMABOND ADVANCED .7 DNX12 (GAUZE/BANDAGES/DRESSINGS) ×1 IMPLANT
DRAPE STERI IOBAN 125X83 (DRAPES) ×3 IMPLANT
DRAPE U-SHAPE 47X51 STRL (DRAPES) ×6 IMPLANT
DRESSING AQUACEL AG SP 3.5X10 (GAUZE/BANDAGES/DRESSINGS) ×1 IMPLANT
DRSG AQUACEL AG ADV 3.5X10 (GAUZE/BANDAGES/DRESSINGS) ×3 IMPLANT
DRSG AQUACEL AG SP 3.5X10 (GAUZE/BANDAGES/DRESSINGS) ×3
DURAPREP 26ML APPLICATOR (WOUND CARE) ×3 IMPLANT
ELECT REM PT RETURN 9FT ADLT (ELECTROSURGICAL) ×3
ELECTRODE REM PT RTRN 9FT ADLT (ELECTROSURGICAL) ×1 IMPLANT
GLOVE BIOGEL M STRL SZ7.5 (GLOVE) ×6 IMPLANT
GLOVE BIOGEL PI IND STRL 7.5 (GLOVE) ×6 IMPLANT
GLOVE BIOGEL PI INDICATOR 7.5 (GLOVE) ×12
GLOVE ORTHO TXT STRL SZ7.5 (GLOVE) ×3 IMPLANT
GLOVE SURG SS PI 7.0 STRL IVOR (GLOVE) ×3 IMPLANT
GLOVE SURG SS PI 7.5 STRL IVOR (GLOVE) ×3 IMPLANT
GOWN STRL REUS W/TWL 2XL LVL3 (GOWN DISPOSABLE) ×3 IMPLANT
GOWN STRL REUS W/TWL LRG LVL3 (GOWN DISPOSABLE) ×3 IMPLANT
GOWN STRL REUS W/TWL XL LVL3 (GOWN DISPOSABLE) ×6 IMPLANT
HOLDER FOLEY CATH W/STRAP (MISCELLANEOUS) ×3 IMPLANT
PACK ANTERIOR HIP CUSTOM (KITS) ×3 IMPLANT
SUT MNCRL AB 4-0 PS2 18 (SUTURE) ×3 IMPLANT
SUT VIC AB 1 CT1 36 (SUTURE) ×9 IMPLANT
SUT VIC AB 2-0 CT1 27 (SUTURE) ×4
SUT VIC AB 2-0 CT1 TAPERPNT 27 (SUTURE) ×2 IMPLANT
SUT VLOC 180 0 24IN GS25 (SUTURE) ×3 IMPLANT
TRAY FOLEY W/METER SILVER 16FR (SET/KITS/TRAYS/PACK) ×3 IMPLANT
YANKAUER SUCT BULB TIP 10FT TU (MISCELLANEOUS) ×3 IMPLANT

## 2016-05-10 NOTE — Transfer of Care (Signed)
Immediate Anesthesia Transfer of Care Note  Patient: Stephen Woods  Procedure(s) Performed: Procedure(s): RIGHT TOTAL HIP ARTHROPLASTY ANTERIOR APPROACH (Right)  Patient Location: PACU  Anesthesia Type:Spinal  Level of Consciousness: awake, alert  and oriented  Airway & Oxygen Therapy: Patient Spontanous Breathing and Patient connected to face mask oxygen  Post-op Assessment: Report given to RN and Post -op Vital signs reviewed and stable  Post vital signs: Reviewed and stable  Last Vitals:  Vitals:   05/10/16 0857  BP: 140/83  Pulse: 75  Resp: 16  Temp: 36.8 C    Last Pain:  Vitals:   05/10/16 0859  TempSrc:   PainSc: 2       Patients Stated Pain Goal: 0 ([id number])  Complications: No apparent anesthesia complications

## 2016-05-10 NOTE — Anesthesia Procedure Notes (Signed)
Spinal  Patient location during procedure: OR End time: 05/10/2016 11:16 AM Staffing Anesthesiologist: Annye Asa Performed: anesthesiologist  Preanesthetic Checklist Completed: patient identified, site marked, surgical consent, pre-op evaluation, timeout performed, IV checked, risks and benefits discussed and monitors and equipment checked Spinal Block Patient position: sitting Prep: Betadine and site prepped and draped Patient monitoring: heart rate, continuous pulse ox and blood pressure Approach: right paramedian Location: L2-3 Injection technique: single-shot Needle Needle type: Quincke (after multiple attempts by CRNA with Pencan)  Needle gauge: 25 G Needle length: 9 cm Additional Notes Pt identified in Operating room.  Monitors applied. Working IV access confirmed. Sterile prep, drape lumbar spine.  1% lido local L 3,4, unsuccessful CSF entry.  Repeat local L 2,3, again unsuccessful entry into CSF. Repeat local R paramedian L 2,3 and #25ga Quincke into clear CSF.  15mg  0.75% Bupivacaine with dextrose injected with asp CSF beginning and end of injection.  Patient asymptomatic, VSS, no heme aspirated, tolerated well.  Jenita Seashore, MD

## 2016-05-10 NOTE — Anesthesia Preprocedure Evaluation (Addendum)
Anesthesia Evaluation  Patient identified by MRN, date of birth, ID band Patient awake    Reviewed: Allergy & Precautions, NPO status , Patient's Chart, lab work & pertinent test results  History of Anesthesia Complications Negative for: history of anesthetic complications  Airway Mallampati: II  TM Distance: >3 FB Neck ROM: Full    Dental  (+) Dental Advisory Given, Edentulous Upper, Missing, Poor Dentition   Pulmonary neg pulmonary ROS,    Pulmonary exam normal breath sounds clear to auscultation       Cardiovascular hypertension, Pt. on medications (-) angina Rhythm:Regular Rate:Normal     Neuro/Psych negative neurological ROS     GI/Hepatic negative GI ROS, Neg liver ROS,   Endo/Other  negative endocrine ROS  Renal/GU negative Renal ROS     Musculoskeletal  (+) Arthritis , Osteoarthritis,    Abdominal   Peds  Hematology negative hematology ROS (+)   Anesthesia Other Findings   Reproductive/Obstetrics                            Anesthesia Physical Anesthesia Plan  ASA: II  Anesthesia Plan: Spinal   Post-op Pain Management:    Induction:   Airway Management Planned: Natural Airway and Simple Face Mask  Additional Equipment:   Intra-op Plan:   Post-operative Plan:   Informed Consent: I have reviewed the patients History and Physical, chart, labs and discussed the procedure including the risks, benefits and alternatives for the proposed anesthesia with the patient or authorized representative who has indicated his/her understanding and acceptance.   Dental advisory given  Plan Discussed with: Surgeon and CRNA  Anesthesia Plan Comments: (Plan routine monitors, SAB)        Anesthesia Quick Evaluation

## 2016-05-10 NOTE — Op Note (Signed)
NAME:  Stephen Woods                ACCOUNT NO.: [id number]      MEDICAL RECORD NO.: [id number]      FACILITY:  [facility]      PHYSICIAN:  Paralee Cancel D  DATE OF BIRTH:  1950/06/06     DATE OF PROCEDURE:  05/10/2016                                 OPERATIVE REPORT         PREOPERATIVE DIAGNOSIS: Right  hip osteoarthritis secondary to dysplasia      POSTOPERATIVE DIAGNOSIS:  Right hip osteoarthritis secondary to dysplasia      PROCEDURE:  Right total hip replacement through an anterior approach   utilizing DePuy THR system, component size 66mm pinnacle cup, a size 36+4 neutral   Altrex liner, a size 8Hi Tri Lock stem with a 36+1.5 delta ceramic   ball.      SURGEON:  Pietro Cassis. Alvan Dame, M.D.      ASSISTANT:  Nehemiah Massed, PA-C     ANESTHESIA:  Spinal.      SPECIMENS:  None.      COMPLICATIONS:  None.      BLOOD LOSS:  350 cc     DRAINS:  None.      INDICATION OF THE PROCEDURE:  Stephen Woods is a 66 y.o. male who had   presented to office for evaluation of right hip pain.  Radiographs revealed   progressive degenerative changes with bone-on-bone   articulation to the  hip joint.  The patient had painful limited range of   motion significantly affecting their overall quality of life.  The patient was failing to    respond to conservative measures, and at this point was ready   to proceed with more definitive measures.  The patient has noted progressive   degenerative changes in his hip, progressive problems and dysfunction   with regarding the hip prior to surgery.  Consent was obtained for   benefit of pain relief.  Specific risk of infection, DVT, component   failure, dislocation, need for revision surgery, as well discussion of   the anterior versus posterior approach were reviewed.  Consent was   obtained for benefit of anterior pain relief through an anterior   approach.      PROCEDURE IN DETAIL:  The patient was brought to operative  theater.   Once adequate anesthesia, preoperative antibiotics, 2 gm of Ancef, 1 gm of Tranexamic Acid, and 10 mg of Decadron administered.   The patient was positioned supine on the OSI Hanna table.  Once adequate   padding of boney process was carried out, we had predraped out the hip, and  used fluoroscopy to confirm orientation of the pelvis and position.      The right hip was then prepped and draped from proximal iliac crest to   mid thigh with shower curtain technique.      Time-out was performed identifying the patient, planned procedure, and   extremity.     An incision was then made 2 cm distal and lateral to the   anterior superior iliac spine extending over the orientation of the   tensor fascia lata muscle and sharp dissection was carried down to the   fascia of the muscle and protractor placed in the soft tissues.  The fascia was then incised.  The muscle belly was identified and swept   laterally and retractor placed along the superior neck.  Following   cauterization of the circumflex vessels and removing some pericapsular   fat, a second cobra retractor was placed on the inferior neck.  A third   retractor was placed on the anterior acetabulum after elevating the   anterior rectus.  A L-capsulotomy was along the line of the   superior neck to the trochanteric fossa, then extended proximally and   distally.  Tag sutures were placed and the retractors were then placed   intracapsular.  We then identified the trochanteric fossa and   orientation of my neck cut, confirmed this radiographically   and then made a neck osteotomy with the femur on traction.  The femoral   head was removed without difficulty or complication.  Traction was let   off and retractors were placed posterior and anterior around the   acetabulum.      The labrum and foveal tissue were debrided.  I began reaming with a 28mm   reamer and reamed up to 27mm reamer with good bony bed preparation and a  18mm   cup was chosen.  The final 8mm Pinnacle cup was then impacted under fluoroscopy  to confirm the depth of penetration and orientation with respect to   abduction.  A screw was placed followed by the hole eliminator.  The final   36+4 neutral Altrex liner was impacted with good visualized rim fit.  The cup was positioned anatomically within the acetabular portion of the pelvis.      At this point, the femur was rolled at 80 degrees.  Further capsule was   released off the inferior aspect of the femoral neck.  I then   released the superior capsule proximally.  The hook was placed laterally   along the femur and elevated manually and held in position with the bed   hook.  The leg was then extended and adducted with the leg rolled to 100   degrees of external rotation.  Once the proximal femur was fully   exposed, I used a box osteotome to set orientation.  I then began   broaching with the starting chili pepper broach and passed this by hand and then broached up to 8.  With the 8 broach in place I chose a high offset neck and did several trial reductions.  The offset was appropriate, leg lengths   appeared to be equal best matched with the +1.5 head ball confirmed radiographically.   Given these findings, I went ahead and dislocated the hip, repositioned all   retractors and positioned the right hip in the extended and abducted position.  The final 8 Hi Tri Lock stem was   chosen and it was impacted down to the level of neck cut.  Based on this   and the trial reduction, a 36+1.5 delta ceramic ball was chosen and   impacted onto a clean and dry trunnion, and the hip was reduced.  The   hip had been irrigated throughout the case again at this point.  I did   reapproximate the superior capsular leaflet to the anterior leaflet   using #1 Vicryl.  The fascia of the   tensor fascia lata muscle was then reapproximated using #1 Vicryl and #0 V-lock sutures.  The   remaining wound was closed  with 2-0 Vicryl and running 4-0 Monocryl.   The hip was  cleaned, dried, and dressed sterilely using Dermabond and   Aquacel dressing.  He was then brought   to recovery room in stable condition tolerating the procedure well.    Nehemiah Massed, PA-C was present for the entirety of the case involved from   preoperative positioning, perioperative retractor management, general   facilitation of the case, as well as primary wound closure as assistant.            Pietro Cassis Alvan Dame, M.D.        05/10/2016 11:21 AM

## 2016-05-10 NOTE — Anesthesia Postprocedure Evaluation (Signed)
Anesthesia Post Note  Patient: Stephen Woods  Procedure(s) Performed: Procedure(s) (LRB): RIGHT TOTAL HIP ARTHROPLASTY ANTERIOR APPROACH (Right)  Patient location during evaluation: PACU Anesthesia Type: Spinal Level of consciousness: awake and alert, oriented and patient cooperative Pain management: pain level controlled Vital Signs Assessment: post-procedure vital signs reviewed and stable Respiratory status: spontaneous breathing, nonlabored ventilation and respiratory function stable Cardiovascular status: blood pressure returned to baseline and stable Postop Assessment: spinal receding, no signs of nausea or vomiting and patient able to bend at knees Anesthetic complications: no       Last Vitals:  Vitals:   05/10/16 1700 05/10/16 1852  BP:  109/73  Pulse:  97  Resp:  15  Temp: 36.5 C 36.5 C    Last Pain:  Vitals:   05/10/16 1852  TempSrc: Oral  PainSc:                  Monee Dembeck,E. Oria Klimas

## 2016-05-10 NOTE — Interval H&P Note (Signed)
History and Physical Interval Note:  05/10/2016 9:52 AM  Stephen Woods  has presented today for surgery, with the diagnosis of Right hip osteoarthritis  The various methods of treatment have been discussed with the patient and family. After consideration of risks, benefits and other options for treatment, the patient has consented to  Procedure(s): RIGHT TOTAL HIP ARTHROPLASTY ANTERIOR APPROACH (Right) as a surgical intervention .  The patient's history has been reviewed, patient examined, no change in status, stable for surgery.  I have reviewed the patient's chart and labs.  Questions were answered to the patient's satisfaction.     [name]

## 2016-05-11 DIAGNOSIS — Q6589 Other specified congenital deformities of hip: Secondary | ICD-10-CM | POA: Diagnosis not present

## 2016-05-11 DIAGNOSIS — Z79899 Other long term (current) drug therapy: Secondary | ICD-10-CM | POA: Diagnosis not present

## 2016-05-11 DIAGNOSIS — Z87891 Personal history of nicotine dependence: Secondary | ICD-10-CM | POA: Diagnosis not present

## 2016-05-11 DIAGNOSIS — Z6829 Body mass index (BMI) 29.0-29.9, adult: Secondary | ICD-10-CM | POA: Diagnosis not present

## 2016-05-11 DIAGNOSIS — E785 Hyperlipidemia, unspecified: Secondary | ICD-10-CM | POA: Diagnosis not present

## 2016-05-11 DIAGNOSIS — M1611 Unilateral primary osteoarthritis, right hip: Secondary | ICD-10-CM | POA: Diagnosis not present

## 2016-05-11 DIAGNOSIS — I10 Essential (primary) hypertension: Secondary | ICD-10-CM | POA: Diagnosis not present

## 2016-05-11 DIAGNOSIS — E663 Overweight: Secondary | ICD-10-CM | POA: Diagnosis not present

## 2016-05-11 DIAGNOSIS — Z880 Allergy status to penicillin: Secondary | ICD-10-CM | POA: Diagnosis not present

## 2016-05-11 LAB — CBC
HEMATOCRIT: 33.5 % — AB (ref 39.0–52.0)
HEMOGLOBIN: 11.2 g/dL — AB (ref 13.0–17.0)
MCH: 29 pg (ref 26.0–34.0)
MCHC: 33.4 g/dL (ref 30.0–36.0)
MCV: 86.8 fL (ref 78.0–100.0)
Platelets: 183 10*3/uL (ref 150–400)
RBC: 3.86 MIL/uL — ABNORMAL LOW (ref 4.22–5.81)
RDW: 14 % (ref 11.5–15.5)
WBC: 14.7 10*3/uL — ABNORMAL HIGH (ref 4.0–10.5)

## 2016-05-11 LAB — BASIC METABOLIC PANEL
Anion gap: 7 (ref 5–15)
BUN: 25 mg/dL — AB (ref 6–20)
CHLORIDE: 103 mmol/L (ref 101–111)
CO2: 26 mmol/L (ref 22–32)
CREATININE: 0.7 mg/dL (ref 0.61–1.24)
Calcium: 8.4 mg/dL — ABNORMAL LOW (ref 8.9–10.3)
GFR calc Af Amer: >60 mL/min (ref >60)
GFR calc non Af Amer: >60 mL/min (ref >60)
GLUCOSE: 149 mg/dL — AB (ref 65–99)
Potassium: 4.3 mmol/L (ref 3.5–5.1)
Sodium: 136 mmol/L (ref 135–145)

## 2016-05-11 MED ORDER — ASPIRIN 81 MG PO CHEW: 81.0000 mg | CHEWABLE_TABLET | Freq: Two times a day (BID) | ORAL | 0 refills | Status: DC

## 2016-05-11 MED ORDER — HYDROCODONE-ACETAMINOPHEN 7.5-325 MG PO TABS: 1.0000 | ORAL_TABLET | ORAL | 0 refills | Status: DC | PRN

## 2016-05-11 MED ORDER — FERROUS SULFATE 325 (65 FE) MG PO TABS: 325.0000 mg | ORAL_TABLET | Freq: Three times a day (TID) | ORAL | 3 refills | Status: DC

## 2016-05-11 MED ORDER — POLYETHYLENE GLYCOL 3350 17 G PO PACK: 17.0000 g | PACK | Freq: Two times a day (BID) | ORAL | 0 refills | Status: DC

## 2016-05-11 MED ORDER — DOCUSATE SODIUM 100 MG PO CAPS: 100.0000 mg | ORAL_CAPSULE | Freq: Two times a day (BID) | ORAL | 0 refills | Status: DC

## 2016-05-11 MED ORDER — METHOCARBAMOL 500 MG PO TABS: 500.0000 mg | ORAL_TABLET | Freq: Four times a day (QID) | ORAL | 0 refills | Status: DC | PRN

## 2016-05-11 NOTE — Care Management Note (Signed)
Case Management Note  Patient Details  Name: ARIANA CAVENAUGH MRN: [id number] Date of Birth: May 25, 1950  Subjective/Objective:                  RIGHT TOTAL HIP ARTHROPLASTY ANTERIOR APPROACH (Right)  Action/Plan: Discharge planning Expected Discharge Date:  05/11/16               Expected Discharge Plan:  [place]  In-House Referral:     Discharge planning Services  CM Consult  Post Acute Care Choice:  Home Health Choice offered to:  Patient  DME Arranged:  N/A DME Agency:  NA  HH Arranged:  PT [place] Agency:  [facility] ([facility]  Status of Service:  Completed, signed off  If discussed at [employer], dates discussed:    Additional Comments: CM met with pt in room to offer choice of home health agency. Pt chooses [facility] to render HHPT. Referral given to [facility] rep, Tim.  Orders and face to face have been requested.  Pt states he has all DME needed at home. No other CM needs were communicated. Dellie Catholic, RN 05/11/2016, 10:47 AM

## 2016-05-11 NOTE — Discharge Instructions (Signed)

## 2016-05-11 NOTE — Evaluation (Signed)
Occupational Therapy Evaluation Patient Details Name: Stephen Woods MRN: [id number] DOB: October 31, 1950 Today's Date: 05/11/2016    History of Present Illness 66 yo male s/p R THA-direct anterior 05/10/16   Clinical Impression   OT education complete regarding ADL activity s/p THA    Follow Up Recommendations  No OT follow up    Equipment Recommendations  3 in 1 bedside commode    Recommendations for Other Services       Precautions / Restrictions Precautions Precautions: Fall;Anterior Hip Restrictions Weight Bearing Restrictions: No RLE Weight Bearing: Weight bearing as tolerated      Mobility Bed Mobility Overal bed mobility: Needs Assistance Bed Mobility: Supine to Sit     Supine to sit: HOB elevated;Min assist     General bed mobility comments: pt in chair  Transfers Overall transfer level: Needs assistance Equipment used: Rolling walker (2 wheeled) Transfers: Sit to/from [facility] Sit to Stand: Min guard Stand pivot transfers: Min guard       General transfer comment: VC for safety and hand placement         ADL Overall ADL's : Needs assistance/impaired Eating/Feeding: Set up;Sitting   Grooming: Wash/dry face;Standing;Cueing for safety;Supervision/safety   Upper Body Bathing: Set up;Sitting   Lower Body Bathing: Minimal assistance;Sit to/from stand;Cueing for safety;Cueing for sequencing   Upper Body Dressing : Set up;Sitting   Lower Body Dressing: Minimal assistance;Sit to/from stand;Cueing for sequencing;Cueing for compensatory techniques;Cueing for safety   Toilet Transfer: RW;Cueing for safety;Cueing for sequencing;Min guard Toilet Transfer Details (indicate cue type and reason): declined walking to bathroom - used urinal in standing.  Toileting- Clothing Manipulation and Hygiene: Sit to/from stand;Cueing for safety;Cueing for sequencing;Supervision/safety     Tub/Shower Transfer Details (indicate cue type and  reason): verbalized safety Functional mobility during ADLs: Min guard;Supervision/safety General ADL Comments: pts wife will A as needed               Pertinent Vitals/Pain Pain Assessment: 0-10 Pain Score: 3  Pain Location: R hip Pain Descriptors / Indicators: Sore Pain Intervention(s): Monitored during session        Extremity/Trunk Assessment Upper Extremity Assessment Upper Extremity Assessment: Overall WFL for tasks assessed   Lower Extremity Assessment Lower Extremity Assessment: Generalized weakness (s/p R THA)   Cervical / Trunk Assessment Cervical / Trunk Assessment: Normal   Communication Communication Communication: No difficulties   Cognition Arousal/Alertness: Awake/alert Behavior During Therapy: WFL for tasks assessed/performed Overall Cognitive Status: Within Functional Limits for tasks assessed                                Home Living Family/patient expects to be discharged to:: Private residence Living Arrangements: Spouse/significant other   Type of Home: House Home Access: Stairs to enter Technical brewer of Steps: 3 Entrance Stairs-Rails: None Home Layout: Able to live on main level with bedroom/bathroom;Two level     Bathroom Shower/Tub: Occupational psychologist: Standard     Home Equipment: Environmental consultant - 2 wheels;Bedside commode          Prior Functioning/Environment Level of Independence: Independent                 OT Problem List:     OT Treatment/Interventions:      OT Goals(Current goals can be found in the care plan section) Acute Rehab OT Goals Patient Stated Goal: home later today  OT Frequency:  Barriers to D/C:               End of Session Equipment Utilized During Treatment: Rolling walker;Gait belt  Activity Tolerance: Patient tolerated treatment well Patient left: in chair;with call bell/phone within reach   Time: 1146-1158 OT Time Calculation (min): 12 min Charges:   OT General Charges $OT Visit: 1 Procedure OT Evaluation $OT Eval Moderate Complexity: 1 Procedure G-Codes:    Payton Mccallum D 2016/05/17, 1:05 PM

## 2016-05-11 NOTE — Progress Notes (Signed)
Pt to d/c home with [facility]. No DME needs. AVS reviewed and "My Chart" discussed with pt. Pt capable of verbalizing medications, signs and symptoms of infection, and follow-up appointments. Remains hemodynamically stable. No signs and symptoms of distress. Educated pt to return to ER in the case of SOB, dizziness, or chest pain.

## 2016-05-11 NOTE — Progress Notes (Signed)
Physical Therapy Treatment Patient Details Name: ARROW JENSEN MRN: [id number] DOB: 1950/11/11 Today's Date: 05/11/2016    History of Present Illness 66 yo male s/p R THA-direct anterior 05/10/16    PT Comments    Progressing well with mobility. Practiced stair negotiation. Pt reports he has a standard walker. He declined offer to have RW delivered to his room. All education completed. Ready to d/c from PT standpoint-made RN aware.   Follow Up Recommendations  Home health PT     Equipment Recommendations  None recommended by PT    Recommendations for Other Services OT consult     Precautions / Restrictions Precautions Precautions: Fall Restrictions Weight Bearing Restrictions: No RLE Weight Bearing: Weight bearing as tolerated    Mobility  Bed Mobility          General bed mobility comments: oob in recliner  Transfers Overall transfer level: Needs assistance Equipment used: Rolling walker (2 wheeled) Transfers: Sit to/from Stand Sit to Stand: Min guard        General transfer comment: VC for safety and hand/Le placement. close guard to rise, stabilize. Min assist to control descent.   Ambulation/Gait Ambulation/Gait assistance: Min guard Ambulation Distance (Feet): 100 Feet Assistive device: Rolling walker (2 wheeled) Gait Pattern/deviations: Step-to pattern;Step-through pattern;Decreased stride length     General Gait Details: close guard for safety.    Stairs Stairs: Yes   Stair Management: Forwards;Backwards;Step to pattern;With walker Number of Stairs: 2 (x3) General stair comments: Practiced x 1 with 1 HHA-pt unable to perform without using handrail on opposite side. So practiced x2 with RW backwards-much easier for pt. Pt declined handout.   Wheelchair Mobility    Modified Rankin (Stroke Patients Only)       Balance                                    Cognition Arousal/Alertness: Awake/alert Behavior During Therapy:  WFL for tasks assessed/performed Overall Cognitive Status: Within Functional Limits for tasks assessed                      Exercises     General Comments        Pertinent Vitals/Pain Pain Assessment: 0-10 Pain Score: 4  Pain Location: R hip/thigh Pain Descriptors / Indicators: Sore Pain Intervention(s): Monitored during session    Home Living Family/patient expects to be discharged to:: Private residence Living Arrangements: Spouse/significant other   Type of Home: House Home Access: Stairs to enter Entrance Stairs-Rails: None Home Layout: Able to live on main level with bedroom/bathroom;Two level Home Equipment: Walker - 2 wheels;Bedside commode      Prior Function Level of Independence: Independent          PT Goals (current goals can now be found in the care plan section) Acute Rehab PT Goals Patient Stated Goal: home later today PT Goal Formulation: With patient Time For Goal Achievement: 05/18/16 Potential to Achieve Goals: Good Progress towards PT goals: Progressing toward goals    Frequency    7X/week      PT Plan Current plan remains appropriate    Co-evaluation             End of Session Equipment Utilized During Treatment: Gait belt Activity Tolerance: Patient tolerated treatment well Patient left: in chair;with call bell/phone within reach     Time: 1320-1333 PT Time Calculation (min) (ACUTE ONLY): 13 min  Charges:  $Gait Training: 8-22 mins                    G Codes:      Weston Anna, MPT Pager: [phone]

## 2016-05-11 NOTE — Progress Notes (Signed)
     Subjective: 1 Day Post-Op Procedure(s) (LRB): RIGHT TOTAL HIP ARTHROPLASTY ANTERIOR APPROACH (Right)   Patient reports pain as mild, pain well controlled. No events throughout the night.  Ready to be discharged home.   Objective:   VITALS:   Vitals:   05/11/16 0227 05/11/16 0547  BP: 106/63 104/66  Pulse: 69 63  Resp: 16 16  Temp: 97.9 F (36.6 C) 97.9 F (36.6 C)    Dorsiflexion/Plantar flexion intact Incision: dressing C/D/I No cellulitis present Compartment soft  LABS  Recent Labs  05/11/16 0432  HGB 11.2*  HCT 33.5*  WBC 14.7*  PLT 183     Recent Labs  05/11/16 0432  NA 136  K 4.3  BUN 25*  CREATININE 0.70  GLUCOSE 149*     Assessment/Plan: 1 Day Post-Op Procedure(s) (LRB): RIGHT TOTAL HIP ARTHROPLASTY ANTERIOR APPROACH (Right) Foley cath d/c'ed Advance diet Up with therapy D/C IV fluids Discharge home Follow up in 2 weeks at [facility]. Follow up with OLIN,Chadwick Reiswig D in 2 weeks.  Contact information:  [facility] [address] [place] [id number]    Overweight (BMI 25-29.9) Estimated body mass index is 29.41 kg/m as calculated from the following:   Height as of this encounter: 5\' 10"  (1.778 m).   Weight as of this encounter: 93 kg (205 lb). Patient also counseled that weight may inhibit the healing process Patient counseled that losing weight will help with future health issues        Stephen Woods. Ahriyah Vannest   PAC  05/11/2016, 8:46 AM

## 2016-05-11 NOTE — Evaluation (Signed)
Physical Therapy Evaluation Patient Details Name: Stephen Woods CURRENT MRN: [id number] DOB: [birth year] Today's Date: 05/11/2016   History of Present Illness  66 yo male s/p R THA-direct anterior 05/10/16  Clinical Impression  On eval, pt required Min assist for mobility. He walked ~60 feet with a RW. Pain rated 5/10 with activity. Will plan to have a 2nd session prior to possible d/c later today. Recommend HHPT.     Follow Up Recommendations Home health PT    Equipment Recommendations  None recommended by PT    Recommendations for Other Services OT consult     Precautions / Restrictions Precautions Precautions: Fall Restrictions Weight Bearing Restrictions: No RLE Weight Bearing: Weight bearing as tolerated      Mobility  Bed Mobility Overal bed mobility: Needs Assistance Bed Mobility: Supine to Sit     Supine to sit: HOB elevated;Min assist     General bed mobility comments: Assist for R LE. VCs technique.   Transfers Overall transfer level: Needs assistance Equipment used: Rolling walker (2 wheeled) Transfers: Sit to/from Stand Sit to Stand: From elevated surface;Min assist         General transfer comment: Assist to control descent. VCs safety, technique, hand/LE placement.   Ambulation/Gait Ambulation/Gait assistance: Min assist Ambulation Distance (Feet): 60 Feet Assistive device: Rolling walker (2 wheeled) Gait Pattern/deviations: Step-to pattern;Antalgic     General Gait Details: Assist to stabilize intermittently. VCs safety, sequence. slow gait speed.   Stairs            Wheelchair Mobility    Modified Rankin (Stroke Patients Only)       Balance                                             Pertinent Vitals/Pain Pain Assessment: 0-10 Pain Score: 5  Pain Location: R hip/thigh Pain Descriptors / Indicators: Sore;Tightness Pain Intervention(s): Monitored during session;Repositioned;Ice applied    Home Living  Family/patient expects to be discharged to:: Private residence Living Arrangements: Spouse/significant other   Type of Home: House Home Access: Stairs to enter Entrance Stairs-Rails: None Entrance Stairs-Number of Steps: 3 Home Layout: Able to live on main level with bedroom/bathroom;Two level Home Equipment: Walker - 2 wheels;Bedside commode      Prior Function Level of Independence: Independent               Hand Dominance        Extremity/Trunk Assessment   Upper Extremity Assessment Upper Extremity Assessment: Defer to OT evaluation    Lower Extremity Assessment Lower Extremity Assessment: Generalized weakness (s/p R THA)    Cervical / Trunk Assessment Cervical / Trunk Assessment: Normal  Communication   Communication: No difficulties  Cognition Arousal/Alertness: Awake/alert Behavior During Therapy: WFL for tasks assessed/performed Overall Cognitive Status: Within Functional Limits for tasks assessed                      General Comments      Exercises Total Joint Exercises Ankle Circles/Pumps: AROM;Both;10 reps;Supine Quad Sets: AROM;Both;10 reps;Supine Heel Slides: AAROM;10 reps;Supine;Right Hip ABduction/ADduction: AAROM;Right;10 reps;Supine   Assessment/Plan    PT Assessment Patient needs continued PT services  PT Problem List Decreased strength;Decreased mobility;Decreased activity tolerance;Decreased balance;Decreased knowledge of use of DME;Pain;Decreased range of motion          PT Treatment Interventions DME instruction;Therapeutic activities;Therapeutic exercise;Gait training;Patient/family education;Balance  training;Functional mobility training;Stair training    PT Goals (Current goals can be found in the Care Plan section)  Acute Rehab PT Goals Patient Stated Goal: home later today PT Goal Formulation: With patient Time For Goal Achievement: 05/18/16 Potential to Achieve Goals: Good    Frequency 7X/week   Barriers to  discharge        Co-evaluation               End of Session Equipment Utilized During Treatment: Gait belt Activity Tolerance: Patient tolerated treatment well Patient left: in chair;with call bell/phone within reach           Time: 1018-1042 PT Time Calculation (min) (ACUTE ONLY): 24 min   Charges:   PT Evaluation $PT Eval Low Complexity: 1 Procedure PT Treatments $Gait Training: 8-22 mins   PT G Codes:        Weston Anna, MPT Pager: [phone]

## 2016-05-13 DIAGNOSIS — Z7982 Long term (current) use of aspirin: Secondary | ICD-10-CM | POA: Diagnosis not present

## 2016-05-13 DIAGNOSIS — I10 Essential (primary) hypertension: Secondary | ICD-10-CM | POA: Diagnosis not present

## 2016-05-13 DIAGNOSIS — Z79891 Long term (current) use of opiate analgesic: Secondary | ICD-10-CM | POA: Diagnosis not present

## 2016-05-13 DIAGNOSIS — E784 Other hyperlipidemia: Secondary | ICD-10-CM | POA: Diagnosis not present

## 2016-05-13 DIAGNOSIS — Z87891 Personal history of nicotine dependence: Secondary | ICD-10-CM | POA: Diagnosis not present

## 2016-05-13 DIAGNOSIS — Z96641 Presence of right artificial hip joint: Secondary | ICD-10-CM | POA: Diagnosis not present

## 2016-05-13 DIAGNOSIS — Z471 Aftercare following joint replacement surgery: Secondary | ICD-10-CM | POA: Diagnosis not present

## 2016-05-14 NOTE — Discharge Summary (Signed)
Physician Discharge Summary  Patient ID: Stephen Woods MRN: [id number] DOB/AGE: 08-26-1950 66 y.o.  Admit date: 05/10/2016 Discharge date: 05/11/2016   Procedures:  Procedure(s) (LRB): RIGHT TOTAL HIP ARTHROPLASTY ANTERIOR APPROACH (Right)  Attending Physician:  Dr. Paralee Cancel   Admission Diagnoses:   Right hip primary OA / pain  Discharge Diagnoses:  Principal Problem:   S/P right THA, AA Active Problems:   S/P hip replacement, right  Past Medical History:  Diagnosis Date  . Arthritis   . Erectile dysfunction   . Hypertension     HPI:    Stephen Woods, 66 y.o. male, has a history of pain and functional disability in the right hip(s) due to arthritis and patient has failed non-surgical conservative treatments for greater than 12 weeks to include NSAID's and/or analgesics, use of assistive devices and activity modification.  Onset of symptoms was gradual starting ~1 years ago with gradually worsening course since that time.The patient noted no past surgery on the right hip(s).  Patient currently rates pain in the right hip at 9 out of 10 with activity. Patient has worsening of pain with activity and weight bearing, trendelenberg gait, pain that interfers with activities of daily living and pain with passive range of motion. Patient has evidence of periarticular osteophytes and joint space narrowing by imaging studies. This condition presents safety issues increasing the risk of falls.  There is no current active infection.   Risks, benefits and expectations were discussed with the patient.  Risks including but not limited to the risk of anesthesia, blood clots, nerve damage, blood vessel damage, failure of the prosthesis, infection and up to and including death.  Patient understand the risks, benefits and expectations and wishes to proceed with surgery.  PCP: Eulas Post, MD   Discharged Condition: good  Hospital Course:  Patient underwent the above stated procedure on  05/10/2016. Patient tolerated the procedure well and brought to the recovery room in good condition and subsequently to the floor.  POD #1 BP: 104/66 ; Pulse: 63 ; Temp: 97.9 F (36.6 C) ; Resp: 16 Patient reports pain as mild, pain well controlled. No events throughout the night.  Ready to be discharged home. Dorsiflexion/plantar flexion intact, incision: dressing C/D/I, no cellulitis present and compartment soft.   LABS  Basename    HGB     11.2  HCT     33.5    Discharge Exam: General appearance: alert, cooperative and no distress Extremities: Homans sign is negative, no sign of DVT, no edema, redness or tenderness in the calves or thighs and no ulcers, gangrene or trophic changes  Disposition: Home with follow up in 2 weeks   Follow-up Information    [name], MD. Schedule an appointment as soon as possible for a visit in 2 week(s).   Specialty:  Orthopedic Surgery Contact information: [address] [place] [address] [phone]        [facility] Follow up.   Specialty:  Home Health Services Why:  home health physical therapy Contact information: [place] [address] [phone]           Discharge Instructions    Call MD / Call 911    Complete by:  As directed    If you experience chest pain or shortness of breath, CALL 911 and be transported to the hospital emergency room.  If you develope a fever above 101 F, pus (white drainage) or increased drainage  or redness at the wound, or calf pain, call your surgeon's office.   Change dressing    Complete by:  As directed    Maintain surgical dressing until follow up in the clinic. If the edges start to pull up, may reinforce with tape. If the dressing is no longer working, may remove and cover with gauze and tape, but must keep the area dry and clean.  Call with any questions or concerns.   Constipation Prevention    Complete by:  As directed    Drink plenty  of fluids.  Prune juice may be helpful.  You may use a stool softener, such as Colace (over the counter) 100 mg twice a day.  Use MiraLax (over the counter) for constipation as needed.   Diet - low sodium heart healthy    Complete by:  As directed    Discharge instructions    Complete by:  As directed    Maintain surgical dressing until follow up in the clinic. If the edges start to pull up, may reinforce with tape. If the dressing is no longer working, may remove and cover with gauze and tape, but must keep the area dry and clean.  [facility]. Call with any questions or concerns.   Increase activity slowly as tolerated    Complete by:  As directed    Weight bearing as tolerated with assist device (walker, cane, etc) as directed, use it as long as suggested by your surgeon or therapist, typically at least 4-6 weeks.   TED hose    Complete by:  As directed    Use stockings (TED hose) for 2 weeks on both leg(s).  You may remove them at night for sleeping.      Allergies as of 05/11/2016      Reactions   Ampicillin Hives   hives      Medication List    STOP taking these medications   acetaminophen 325 MG tablet Commonly known as:  TYLENOL     TAKE these medications   aspirin 81 MG chewable tablet Chew 1 tablet (81 mg total) by mouth 2 (two) times daily. Take for 4 weeks.   atorvastatin 20 MG tablet Commonly known as:  LIPITOR Take 1 tablet (20 mg total) by mouth daily.   CALCIUM 600-D 600-400 MG-UNIT Tabs Generic drug:  Calcium Carbonate-Vitamin D3 Take 1 tablet by mouth daily.   docusate sodium 100 MG capsule Commonly known as:  COLACE Take 1 capsule (100 mg total) by mouth 2 (two) times daily.   ferrous sulfate 325 (65 FE) MG tablet Take 1 tablet (325 mg total) by mouth 3 (three) times daily after meals.   Fish Oil 1000 MG Caps Take 1 capsule by mouth daily.   Ginkgo Biloba 40 MG Tabs Take 2 tablets by mouth daily.   GLUCOSAMINE  CHOND COMPLEX/MSM PO Take 2 tablets by mouth daily.   HYDROcodone-acetaminophen 7.5-325 MG tablet Commonly known as:  NORCO Take 1-2 tablets by mouth every 4 (four) hours as needed for moderate pain.   lisinopril 10 MG tablet Commonly known as:  PRINIVIL,ZESTRIL Take 1 tablet (10 mg total) by mouth daily.   magnesium oxide 400 MG tablet Commonly known as:  MAG-OX Take 400 mg by mouth daily.   methocarbamol 500 MG tablet Commonly known as:  ROBAXIN Take 1 tablet (500 mg total) by mouth every 6 (six) hours as needed for muscle spasms.   multivitamin with minerals tablet  Take 1 tablet by mouth daily.   polyethylene glycol packet Commonly known as:  MIRALAX / GLYCOLAX Take 17 g by mouth 2 (two) times daily.   sildenafil 20 MG tablet Commonly known as:  REVATIO Take 1 tablet (20 mg total) by mouth daily as needed.   Vitamin D-3 5000 units Tabs Take 1 tablet by mouth daily.   zinc gluconate 50 MG tablet Take 50 mg by mouth daily.        Signed: West Pugh. Cybil Senegal   PA-C  05/14/2016, 9:59 AM

## 2016-05-18 DIAGNOSIS — E784 Other hyperlipidemia: Secondary | ICD-10-CM | POA: Diagnosis not present

## 2016-05-18 DIAGNOSIS — Z7982 Long term (current) use of aspirin: Secondary | ICD-10-CM | POA: Diagnosis not present

## 2016-05-18 DIAGNOSIS — Z79891 Long term (current) use of opiate analgesic: Secondary | ICD-10-CM | POA: Diagnosis not present

## 2016-05-18 DIAGNOSIS — Z96641 Presence of right artificial hip joint: Secondary | ICD-10-CM | POA: Diagnosis not present

## 2016-05-18 DIAGNOSIS — Z471 Aftercare following joint replacement surgery: Secondary | ICD-10-CM | POA: Diagnosis not present

## 2016-05-18 DIAGNOSIS — Z87891 Personal history of nicotine dependence: Secondary | ICD-10-CM | POA: Diagnosis not present

## 2016-05-18 DIAGNOSIS — I10 Essential (primary) hypertension: Secondary | ICD-10-CM | POA: Diagnosis not present

## 2016-05-23 DIAGNOSIS — Z471 Aftercare following joint replacement surgery: Secondary | ICD-10-CM | POA: Diagnosis not present

## 2016-05-23 DIAGNOSIS — Z96641 Presence of right artificial hip joint: Secondary | ICD-10-CM | POA: Diagnosis not present

## 2016-05-23 DIAGNOSIS — E784 Other hyperlipidemia: Secondary | ICD-10-CM | POA: Diagnosis not present

## 2016-05-23 DIAGNOSIS — Z87891 Personal history of nicotine dependence: Secondary | ICD-10-CM | POA: Diagnosis not present

## 2016-05-23 DIAGNOSIS — Z7982 Long term (current) use of aspirin: Secondary | ICD-10-CM | POA: Diagnosis not present

## 2016-05-23 DIAGNOSIS — Z79891 Long term (current) use of opiate analgesic: Secondary | ICD-10-CM | POA: Diagnosis not present

## 2016-05-23 DIAGNOSIS — I10 Essential (primary) hypertension: Secondary | ICD-10-CM | POA: Diagnosis not present

## 2016-06-07 DIAGNOSIS — H52222 Regular astigmatism, left eye: Secondary | ICD-10-CM | POA: Diagnosis not present

## 2016-06-22 DIAGNOSIS — Z96641 Presence of right artificial hip joint: Secondary | ICD-10-CM | POA: Diagnosis not present

## 2016-06-22 DIAGNOSIS — Z471 Aftercare following joint replacement surgery: Secondary | ICD-10-CM | POA: Diagnosis not present

## 2016-06-28 ENCOUNTER — Other Ambulatory Visit: Payer: PPO

## 2016-07-01 ENCOUNTER — Other Ambulatory Visit (INDEPENDENT_AMBULATORY_CARE_PROVIDER_SITE_OTHER): Payer: PPO

## 2016-07-01 DIAGNOSIS — Z0000 Encounter for general adult medical examination without abnormal findings: Secondary | ICD-10-CM

## 2016-07-01 LAB — CBC WITH DIFFERENTIAL/PLATELET
BASOS PCT: 0.8 % (ref 0.0–3.0)
Basophils Absolute: 0 10*3/uL (ref 0.0–0.1)
EOS PCT: 5.9 % — AB (ref 0.0–5.0)
Eosinophils Absolute: 0.3 10*3/uL (ref 0.0–0.7)
HCT: 40.2 % (ref 39.0–52.0)
Hemoglobin: 13.4 g/dL (ref 13.0–17.0)
LYMPHS ABS: 2 10*3/uL (ref 0.7–4.0)
Lymphocytes Relative: 34.2 % (ref 12.0–46.0)
MCHC: 33.2 g/dL (ref 30.0–36.0)
MCV: 88.3 fl (ref 78.0–100.0)
MONO ABS: 0.8 10*3/uL (ref 0.1–1.0)
MONOS PCT: 13.3 % — AB (ref 3.0–12.0)
NEUTROS ABS: 2.7 10*3/uL (ref 1.4–7.7)
NEUTROS PCT: 45.8 % (ref 43.0–77.0)
PLATELETS: 217 10*3/uL (ref 150.0–400.0)
RBC: 4.55 Mil/uL (ref 4.22–5.81)
RDW: 15.3 % (ref 11.5–15.5)
WBC: 5.8 10*3/uL (ref 4.0–10.5)

## 2016-07-01 LAB — BASIC METABOLIC PANEL
BUN: 19 mg/dL (ref 6–23)
CHLORIDE: 99 meq/L (ref 96–112)
CO2: 29 meq/L (ref 19–32)
Calcium: 9.8 mg/dL (ref 8.4–10.5)
Creatinine, Ser: 0.82 mg/dL (ref 0.40–1.50)
GFR: 100.1 mL/min (ref >60.00)
GLUCOSE: 104 mg/dL — AB (ref 70–99)
POTASSIUM: 5.2 meq/L — AB (ref 3.5–5.1)
SODIUM: 135 meq/L (ref 135–145)

## 2016-07-01 LAB — LIPID PANEL
CHOLESTEROL: 129 mg/dL (ref 0–200)
HDL: 44.8 mg/dL (ref >39.00)
LDL CALC: 69 mg/dL (ref 0–99)
NonHDL: 84.67
TRIGLYCERIDES: 77 mg/dL (ref 0.0–149.0)
Total CHOL/HDL Ratio: 3
VLDL: 15.4 mg/dL (ref 0.0–40.0)

## 2016-07-01 LAB — HEPATIC FUNCTION PANEL
ALBUMIN: 4.1 g/dL (ref 3.5–5.2)
ALT: 26 U/L (ref 0–53)
AST: 23 U/L (ref 0–37)
Alkaline Phosphatase: 70 U/L (ref 39–117)
BILIRUBIN TOTAL: 0.5 mg/dL (ref 0.2–1.2)
Bilirubin, Direct: 0.1 mg/dL (ref 0.0–0.3)
Total Protein: 7.4 g/dL (ref 6.0–8.3)

## 2016-07-01 LAB — TSH: TSH: 1.25 u[IU]/mL (ref 0.35–4.50)

## 2016-07-01 LAB — PSA: PSA: 1.38 ng/mL (ref 0.10–4.00)

## 2016-07-05 ENCOUNTER — Encounter: Payer: Self-pay | Admitting: Family Medicine

## 2016-07-05 ENCOUNTER — Ambulatory Visit (INDEPENDENT_AMBULATORY_CARE_PROVIDER_SITE_OTHER): Payer: PPO | Admitting: Family Medicine

## 2016-07-05 VITALS — BP 120/84 | HR 86 | Temp 98.3°F | Ht 69.0 in | Wt 213.7 lb

## 2016-07-05 DIAGNOSIS — Z23 Encounter for immunization: Secondary | ICD-10-CM

## 2016-07-05 DIAGNOSIS — Z0000 Encounter for general adult medical examination without abnormal findings: Secondary | ICD-10-CM

## 2016-07-05 MED ORDER — ATORVASTATIN CALCIUM 20 MG PO TABS: 20.0000 mg | ORAL_TABLET | Freq: Every day | ORAL | 3 refills | Status: DC

## 2016-07-05 MED ORDER — SILDENAFIL CITRATE 20 MG PO TABS: ORAL_TABLET | ORAL | 3 refills | Status: DC

## 2016-07-05 MED ORDER — LISINOPRIL 10 MG PO TABS: 10.0000 mg | ORAL_TABLET | Freq: Every day | ORAL | 3 refills | Status: DC

## 2016-07-05 NOTE — Progress Notes (Signed)
Subjective:     Patient ID: Stephen Woods, male   DOB: January 22, 1951, 66 y.o.   MRN: [id number]  HPI Patient seen for complete physical. He has history of hypertension, hyperlipidemia and osteoarthritis with recent total right hip replacement.   Has done well since then. He did gain some weight over the wintertime which he attributes to less activity. He hopes to get more active soon.  Has never had screening colonoscopy and declines. No history of pneumonia vaccination.  Medications reviewed. Compliant with all. Needs refills of Lipitor and lisinopril today. No recent chest pains. Generally feels well overall.  Past Medical History:  Diagnosis Date  . Arthritis   . Erectile dysfunction   . Hypertension    Past Surgical History:  Procedure Laterality Date  . NO PAST SURGERIES    . TOTAL HIP ARTHROPLASTY Right 05/10/2016   Procedure: RIGHT TOTAL HIP ARTHROPLASTY ANTERIOR APPROACH;  Surgeon: Paralee Cancel, MD;  Location: [facility];  Service: Orthopedics;  Laterality: Right;    reports that he has never smoked. His smokeless tobacco use includes Snuff. He reports that he drinks about 4.2 oz of alcohol per week . He reports that he does not use drugs. family history includes Arthritis in his mother; Diabetes in his brother; Heart disease (age of onset: 10) in his father; Heart disease (age of onset: 59) in his mother; Heart disease (age of onset: 19) in his brother; Hypertension in his father; Stroke in his father. Allergies  Allergen Reactions  . Ampicillin Hives    hives     Review of Systems  Constitutional: Negative for activity change, appetite change, fatigue and fever.  HENT: Negative for congestion, ear pain and trouble swallowing.   Eyes: Negative for pain and visual disturbance.  Respiratory: Negative for cough, shortness of breath and wheezing.   Cardiovascular: Negative for chest pain and palpitations.  Gastrointestinal: Negative for abdominal distention, abdominal pain, blood  in stool, constipation, diarrhea, nausea, rectal pain and vomiting.  Genitourinary: Negative for dysuria, hematuria and testicular pain.  Musculoskeletal: Negative for arthralgias and joint swelling.  Skin: Negative for rash.  Neurological: Negative for dizziness, syncope and headaches.  Hematological: Negative for adenopathy.  Psychiatric/Behavioral: Negative for confusion and dysphoric mood.       Objective:   Physical Exam  Constitutional: He is oriented to person, place, and time. He appears well-developed and well-nourished. No distress.  HENT:  Head: Normocephalic and atraumatic.  Right Ear: External ear normal.  Left Ear: External ear normal.  Mouth/Throat: Oropharynx is clear and moist.  Eyes: Conjunctivae and EOM are normal. Pupils are equal, round, and reactive to light.  Neck: Normal range of motion. Neck supple. No thyromegaly present.  Cardiovascular: Normal rate, regular rhythm and normal heart sounds.   No murmur heard. Pulmonary/Chest: No respiratory distress. He has no wheezes. He has no rales.  Abdominal: Soft. Bowel sounds are normal. He exhibits no distension and no mass. There is no tenderness. There is no rebound and no guarding.  Musculoskeletal: He exhibits no edema.  Lymphadenopathy:    He has no cervical adenopathy.  Neurological: He is alert and oriented to person, place, and time. He displays normal reflexes. No cranial nerve deficit.  Skin: No rash noted.  Psychiatric: He has a normal mood and affect.       Assessment:     Physical exam. Patient needs colon cancer screening and declines all forms of screening including colonoscopy, Hemoccults, and Cologuard.  We did convince him to  get Prevnar 13 and recommended Pneumovax in 1 year. We recommended yearly flu vaccination    Plan:     -encouraged to lose some weight -Labs reviewed with no major concerns -Discontinue iron supplementation -Prevnar 13 given as above -Refilled lisinopril and Lipitor  for one year  Eulas Post MD [facility] at [facility]

## 2016-07-05 NOTE — Progress Notes (Signed)
Pre visit review using our clinic review tool, if applicable. No additional management support is needed unless otherwise documented below in the visit note. 

## 2016-07-05 NOTE — Patient Instructions (Signed)
Let us know if you change your mind about colon cancer screening Try to lose some weight Should be able to stop the iron now.

## 2016-09-14 DIAGNOSIS — Z96641 Presence of right artificial hip joint: Secondary | ICD-10-CM | POA: Diagnosis not present

## 2016-09-14 DIAGNOSIS — G8929 Other chronic pain: Secondary | ICD-10-CM | POA: Diagnosis not present

## 2016-09-14 DIAGNOSIS — M25511 Pain in right shoulder: Secondary | ICD-10-CM | POA: Diagnosis not present

## 2016-09-14 DIAGNOSIS — Z471 Aftercare following joint replacement surgery: Secondary | ICD-10-CM | POA: Diagnosis not present

## 2016-09-14 DIAGNOSIS — M19011 Primary osteoarthritis, right shoulder: Secondary | ICD-10-CM | POA: Diagnosis not present

## 2017-01-05 ENCOUNTER — Encounter: Payer: Self-pay | Admitting: Family Medicine

## 2017-04-28 DIAGNOSIS — Z96649 Presence of unspecified artificial hip joint: Secondary | ICD-10-CM | POA: Diagnosis not present

## 2017-04-28 DIAGNOSIS — M7061 Trochanteric bursitis, right hip: Secondary | ICD-10-CM | POA: Diagnosis not present

## 2017-04-28 DIAGNOSIS — Z96641 Presence of right artificial hip joint: Secondary | ICD-10-CM | POA: Diagnosis not present

## 2017-07-11 ENCOUNTER — Encounter: Payer: PPO | Admitting: Family Medicine

## 2017-07-14 ENCOUNTER — Ambulatory Visit (INDEPENDENT_AMBULATORY_CARE_PROVIDER_SITE_OTHER): Payer: PPO | Admitting: Family Medicine

## 2017-07-14 ENCOUNTER — Encounter: Payer: Self-pay | Admitting: Family Medicine

## 2017-07-14 VITALS — BP 110/64 | HR 81 | Temp 98.0°F | Ht 68.75 in | Wt 206.0 lb

## 2017-07-14 DIAGNOSIS — Z23 Encounter for immunization: Secondary | ICD-10-CM | POA: Diagnosis not present

## 2017-07-14 DIAGNOSIS — E785 Hyperlipidemia, unspecified: Secondary | ICD-10-CM

## 2017-07-14 DIAGNOSIS — Z1331 Encounter for screening for depression: Secondary | ICD-10-CM | POA: Diagnosis not present

## 2017-07-14 DIAGNOSIS — I10 Essential (primary) hypertension: Secondary | ICD-10-CM | POA: Diagnosis not present

## 2017-07-14 DIAGNOSIS — Z0000 Encounter for general adult medical examination without abnormal findings: Secondary | ICD-10-CM

## 2017-07-14 MED ORDER — SILDENAFIL CITRATE 20 MG PO TABS: ORAL_TABLET | ORAL | 3 refills | Status: DC

## 2017-07-14 NOTE — Progress Notes (Signed)
Subjective:     Patient ID: Stephen Woods, male   DOB: 06/12/1950, 67 y.o.   MRN: [id number]  HPI Patient seen for physical exam. He has history of hyperlipidemia, osteoarthritis, hypertension. Has never had screening colonoscopy. He is undecided at this point. No history of shingles vaccine. Needs Pneumovax. Tetanus booster due.  Went back to work earlier this year full-time. He works in a Writer. Nonsmoker. No regular alcohol use.  Past Medical History:  Diagnosis Date  . Arthritis   . Erectile dysfunction   . Hypertension    Past Surgical History:  Procedure Laterality Date  . NO PAST SURGERIES    . TOTAL HIP ARTHROPLASTY Right 05/10/2016   Procedure: RIGHT TOTAL HIP ARTHROPLASTY ANTERIOR APPROACH;  Surgeon: Paralee Cancel, MD;  Location: [facility];  Service: Orthopedics;  Laterality: Right;    reports that he has never smoked. His smokeless tobacco use includes snuff. He reports that he drinks about 4.2 oz of alcohol per week. He reports that he does not use drugs. family history includes Arthritis in his mother; Diabetes in his brother; Heart disease (age of onset: 42) in his father; Heart disease (age of onset: 64) in his mother; Heart disease (age of onset: 5) in his brother; Hypertension in his father; Stroke in his father. Allergies  Allergen Reactions  . Ampicillin Hives    hives     Review of Systems  Constitutional: Negative for activity change, appetite change, fatigue and fever.  HENT: Negative for congestion, ear pain and trouble swallowing.   Eyes: Negative for pain and visual disturbance.  Respiratory: Negative for cough, shortness of breath and wheezing.   Cardiovascular: Negative for chest pain and palpitations.  Gastrointestinal: Negative for abdominal distention, abdominal pain, blood in stool, constipation, diarrhea, nausea, rectal pain and vomiting.  Genitourinary: Negative for dysuria, hematuria and testicular pain.  Musculoskeletal: Negative for  arthralgias and joint swelling.  Skin: Negative for rash.  Neurological: Negative for dizziness, syncope and headaches.  Hematological: Negative for adenopathy.  Psychiatric/Behavioral: Negative for confusion and dysphoric mood.       Objective:   Physical Exam  Constitutional: He is oriented to person, place, and time. He appears well-developed and well-nourished. No distress.  HENT:  Head: Normocephalic and atraumatic.  Right Ear: External ear normal.  Left Ear: External ear normal.  Mouth/Throat: Oropharynx is clear and moist.  Eyes: Pupils are equal, round, and reactive to light. Conjunctivae and EOM are normal.  Neck: Normal range of motion. Neck supple. No thyromegaly present.  Cardiovascular: Normal rate, regular rhythm and normal heart sounds.  No murmur heard. Pulmonary/Chest: No respiratory distress. He has no wheezes. He has no rales.  Abdominal: Soft. Bowel sounds are normal. He exhibits no distension and no mass. There is no tenderness. There is no rebound and no guarding.  Musculoskeletal: He exhibits no edema.  Lymphadenopathy:    He has no cervical adenopathy.  Neurological: He is alert and oriented to person, place, and time. He displays normal reflexes. No cranial nerve deficit.  Skin: No rash noted.  Psychiatric: He has a normal mood and affect.       Assessment:     Physical exam Several issues addressed as below    Plan:     -discussed shingles vaccine and he will check on coverage -We've encouraged colonoscopy and he declines at this time -Recommend tetanus booster and Pneumovax and he agrees to these -Recommend yearly flu vaccine -Obtain follow-up labs with lipid panel, hepatic  panel, TSH, CBC, PSA, basic metabolic panel, hepatitis C antibody  Stephen Post MD [facility] at [facility]

## 2017-07-14 NOTE — Patient Instructions (Signed)
Consider Colonoscopy screen and let me know if OK to set up Consider new shingles vaccine (Shingrix) and let us know if interested.

## 2017-07-15 LAB — HEPATIC FUNCTION PANEL
AG Ratio: 1.3 (calc) (ref 1.0–2.5)
ALBUMIN MSPROF: 4.2 g/dL (ref 3.6–5.1)
ALT: 29 U/L (ref 9–46)
AST: 27 U/L (ref 10–35)
Alkaline phosphatase (APISO): 53 U/L (ref 40–115)
Bilirubin, Direct: 0.2 mg/dL (ref 0.0–0.2)
Globulin: 3.2 g/dL (calc) (ref 1.9–3.7)
Indirect Bilirubin: 0.6 mg/dL (calc) (ref 0.2–1.2)
TOTAL PROTEIN: 7.4 g/dL (ref 6.1–8.1)
Total Bilirubin: 0.8 mg/dL (ref 0.2–1.2)

## 2017-07-15 LAB — LIPID PANEL
CHOLESTEROL: 141 mg/dL (ref <200)
HDL: 50 mg/dL (ref >40)
LDL CHOLESTEROL (CALC): 76 mg/dL
Non-HDL Cholesterol (Calc): 91 mg/dL (calc) (ref <130)
TRIGLYCERIDES: 70 mg/dL (ref <150)
Total CHOL/HDL Ratio: 2.8 (calc) (ref <5.0)

## 2017-07-15 LAB — BASIC METABOLIC PANEL
BUN: 24 mg/dL (ref 7–25)
CALCIUM: 9.4 mg/dL (ref 8.6–10.3)
CHLORIDE: 103 mmol/L (ref 98–110)
CO2: 26 mmol/L (ref 20–32)
Creat: 0.71 mg/dL (ref 0.70–1.25)
Glucose, Bld: 88 mg/dL (ref 65–99)
POTASSIUM: 5 mmol/L (ref 3.5–5.3)
Sodium: 139 mmol/L (ref 135–146)

## 2017-07-15 LAB — CBC WITH DIFFERENTIAL/PLATELET
Basophils Absolute: 51 cells/uL (ref 0–200)
Basophils Relative: 0.9 %
EOS PCT: 4.6 %
Eosinophils Absolute: 262 cells/uL (ref 15–500)
HCT: 42.2 % (ref 38.5–50.0)
Hemoglobin: 14.8 g/dL (ref 13.2–17.1)
Lymphs Abs: 1767 cells/uL (ref 850–3900)
MCH: 31.2 pg (ref 27.0–33.0)
MCHC: 35.1 g/dL (ref 32.0–36.0)
MCV: 88.8 fL (ref 80.0–100.0)
MPV: 10.8 fL (ref 7.5–12.5)
Monocytes Relative: 12.3 %
NEUTROS PCT: 51.2 %
Neutro Abs: 2918 cells/uL (ref 1500–7800)
PLATELETS: 184 10*3/uL (ref 140–400)
RBC: 4.75 10*6/uL (ref 4.20–5.80)
RDW: 13 % (ref 11.0–15.0)
TOTAL LYMPHOCYTE: 31 %
WBC mixed population: 701 cells/uL (ref 200–950)
WBC: 5.7 10*3/uL (ref 3.8–10.8)

## 2017-07-15 LAB — PSA: PSA: 1.2 ng/mL (ref <=4.0)

## 2017-07-15 LAB — HEPATITIS C ANTIBODY
Hepatitis C Ab: NONREACTIVE
SIGNAL TO CUT-OFF: 0.01 (ref <1.00)

## 2017-07-15 LAB — TSH: TSH: 0.91 mIU/L (ref 0.40–4.50)

## 2017-07-17 ENCOUNTER — Encounter: Payer: Self-pay | Admitting: Family Medicine

## 2017-07-17 ENCOUNTER — Other Ambulatory Visit: Payer: Self-pay | Admitting: Family Medicine

## 2017-07-18 ENCOUNTER — Encounter: Payer: Self-pay | Admitting: Family Medicine

## 2017-07-18 MED ORDER — ATORVASTATIN CALCIUM 20 MG PO TABS: 20.0000 mg | ORAL_TABLET | Freq: Every day | ORAL | 3 refills | Status: DC

## 2017-07-18 NOTE — Telephone Encounter (Signed)
Pt. Wife called about this prescription.  She is a little frustrated, both were to be called in on Friday 3/29. Please send prescription in.   Please call Stephen Woods, wife  when sent to pharmacy.  [phone] Thank you

## 2017-07-18 NOTE — Telephone Encounter (Signed)
These prescriptions are to be a 90 day supply

## 2017-08-14 ENCOUNTER — Encounter: Payer: Self-pay | Admitting: *Deleted

## 2017-08-14 ENCOUNTER — Encounter: Payer: Self-pay | Admitting: Family Medicine

## 2017-08-14 ENCOUNTER — Ambulatory Visit (INDEPENDENT_AMBULATORY_CARE_PROVIDER_SITE_OTHER): Payer: PPO | Admitting: Family Medicine

## 2017-08-14 VITALS — BP 124/78 | HR 71 | Temp 98.2°F | Resp 12 | Ht 68.75 in | Wt 207.0 lb

## 2017-08-14 DIAGNOSIS — R0789 Other chest pain: Secondary | ICD-10-CM | POA: Diagnosis not present

## 2017-08-14 DIAGNOSIS — R05 Cough: Secondary | ICD-10-CM

## 2017-08-14 DIAGNOSIS — R059 Cough, unspecified: Secondary | ICD-10-CM

## 2017-08-14 DIAGNOSIS — J069 Acute upper respiratory infection, unspecified: Secondary | ICD-10-CM | POA: Diagnosis not present

## 2017-08-14 MED ORDER — FLUTICASONE PROPIONATE 50 MCG/ACT NA SUSP: 1.0000 | Freq: Two times a day (BID) | NASAL | 0 refills | Status: DC

## 2017-08-14 MED ORDER — BENZONATATE 100 MG PO CAPS: 200.0000 mg | ORAL_CAPSULE | Freq: Two times a day (BID) | ORAL | 0 refills | Status: AC | PRN

## 2017-08-14 NOTE — Patient Instructions (Signed)
  Mr.Harish R Guerrero I have seen you today for an acute visit.  A few things to remember from today's visit:   No diagnosis found.     viral infections are self-limited and we treat each symptom depending of severity.  Over the counter medications as decongestants and cold medications usually help, they need to be taken with caution if there is a history of high blood pressure or palpitations.So not recommended.  Tylenol also helps with most symptoms (headache, muscle aching, fever,etc) Plenty of fluids. Honey helps with cough. Steam inhalations helps with runny nose, nasal congestion, and may prevent sinus infections. Cough and nasal congestion could last a few days and sometimes weeks. Please follow in not any better in 1-2 weeks or if symptoms get worse.  Monitor for fever.  Let me know if you change up your mind about chest X ray.   In general please monitor for signs of worsening symptoms and seek immediate medical attention if any concerning.  If symptoms are not resolved in 10-14 days you should schedule a follow up appointment with your doctor, before if needed.  I hope you get better soon!

## 2017-08-14 NOTE — Progress Notes (Signed)
ACUTE VISIT  HPI:  Chief Complaint  Patient presents with  . Cough  . Nasal Congestion    Mr.Stephen Woods is a 67 y.o.male here today complaining of 5-7 days of respiratory symptoms. He states that he has had "little" SOB and wheezing. No Hx of asthma or COPD. Chest pain,left-sided, with coughing spells. Cough is mainly non productive, no hemoptysis. He had chills a couple days ago for 1-2 days. No fever.   URI   This is a new problem. The current episode started in the past 7 days. The problem has been unchanged. There has been no fever. Associated symptoms include congestion, coughing, a plugged ear sensation, rhinorrhea and a sore throat. Pertinent negatives include no abdominal pain, diarrhea, ear pain, headaches, nausea, rash, sinus pain, sneezing, vomiting or wheezing. He has tried decongestant for the symptoms. The treatment provided mild relief.    No Hx of recent travel. No sick contact. No known insect bite.  Hx of allergies: Yes, seasonal allergies.  OTC medications for this problem: Alka seltzer Plus  Symptoms otherwise stable.     Review of Systems  Constitutional: Positive for fatigue. Negative for activity change, appetite change and fever.  HENT: Positive for congestion, postnasal drip, rhinorrhea and sore throat. Negative for ear pain, mouth sores, sinus pain, sneezing, trouble swallowing and voice change.   Eyes: Negative for discharge, redness and itching.  Respiratory: Positive for cough. Negative for shortness of breath and wheezing.   Cardiovascular: Negative for palpitations and leg swelling.  Gastrointestinal: Negative for abdominal pain, diarrhea, nausea and vomiting.  Musculoskeletal: Negative for gait problem and myalgias.  Skin: Negative for rash.  Allergic/Immunologic: Positive for environmental allergies.  Neurological: Negative for syncope, weakness and headaches.  Hematological: Negative for adenopathy. Does not bruise/bleed  easily.  Psychiatric/Behavioral: Negative for confusion and sleep disturbance.      Current Outpatient Medications on File Prior to Visit  Medication Sig Dispense Refill  . aspirin 81 MG chewable tablet Chew 1 tablet (81 mg total) by mouth 2 (two) times daily. Take for 4 weeks. 60 tablet 0  . atorvastatin (LIPITOR) 20 MG tablet Take 1 tablet (20 mg total) by mouth daily. 90 tablet 3  . Calcium Carbonate-Vitamin D3 (CALCIUM 600-D) 600-400 MG-UNIT TABS Take 1 tablet by mouth daily.    . Cholecalciferol (VITAMIN D-3) 5000 units TABS Take 1 tablet by mouth daily.    Marland Kitchen docusate sodium (COLACE) 100 MG capsule Take 1 capsule (100 mg total) by mouth 2 (two) times daily. 10 capsule 0  . Ginkgo Biloba 40 MG TABS Take 2 tablets by mouth daily.    Marland Kitchen lisinopril (PRINIVIL,ZESTRIL) 10 MG tablet TAKE 1 TABLET BY MOUTH ONCE DAILY 90 tablet 3  . magnesium oxide (MAG-OX) 400 MG tablet Take 400 mg by mouth daily.    . Misc Natural Products (GLUCOSAMINE CHOND COMPLEX/MSM PO) Take 2 tablets by mouth daily.    . Multiple Vitamins-Minerals (MULTIVITAMIN WITH MINERALS) tablet Take 1 tablet by mouth daily.    . Omega-3 Fatty Acids (FISH OIL) 1000 MG CAPS Take 1 capsule by mouth daily.    . polyethylene glycol (MIRALAX / GLYCOLAX) packet Take 17 g by mouth 2 (two) times daily. 14 each 0  . sildenafil (REVATIO) 20 MG tablet Take 2 to 5 tablets one hour prior to sexual activity 40 tablet 3  . zinc gluconate 50 MG tablet Take 50 mg by mouth daily.     No current facility-administered  medications on file prior to visit.      Past Medical History:  Diagnosis Date  . Arthritis   . Erectile dysfunction   . Hypertension    Allergies  Allergen Reactions  . Ampicillin Hives    hives    Social History   Socioeconomic History  . Marital status: Married    Spouse name: Not on file  . Number of children: Not on file  . Years of education: Not on file  . Highest education level: Not on file  Occupational  History  . Not on file  Social Needs  . Financial resource strain: Not on file  . Food insecurity:    Worry: Not on file    Inability: Not on file  . Transportation needs:    Medical: Not on file    Non-medical: Not on file  Tobacco Use  . Smoking status: Never Smoker  . Smokeless tobacco: Current User    Types: Snuff  Substance and Sexual Activity  . Alcohol use: Yes    Alcohol/week: 4.2 oz    Types: 7 Glasses of wine per week    Comment: 1 glass with dinner  . Drug use: No  . Sexual activity: Not on file  Lifestyle  . Physical activity:    Days per week: Not on file    Minutes per session: Not on file  . Stress: Not on file  Relationships  . Social connections:    Talks on phone: Not on file    Gets together: Not on file    Attends religious service: Not on file    Active member of club or organization: Not on file    Attends meetings of clubs or organizations: Not on file    Relationship status: Not on file  Other Topics Concern  . Not on file  Social History Narrative  . Not on file    Vitals:   08/14/17 0908  BP: 124/78  Pulse: 71  Resp: 12  Temp: 98.2 F (36.8 C)  SpO2: 97%   Body mass index is 30.79 kg/m.   Physical Exam  Nursing note and vitals reviewed. Constitutional: He is oriented to person, place, and time. He appears well-developed. He does not appear ill. No distress.  HENT:  Head: Normocephalic and atraumatic.  Right Ear: Tympanic membrane, external ear and ear canal normal.  Left Ear: Tympanic membrane, external ear and ear canal normal.  Nose: Rhinorrhea present. Right sinus exhibits no maxillary sinus tenderness and no frontal sinus tenderness. Left sinus exhibits no maxillary sinus tenderness and no frontal sinus tenderness.  Mouth/Throat: Oropharynx is clear and moist and mucous membranes are normal.  Hypertrophic turbinates,post nasal drainage.   Eyes: Pupils are equal, round, and reactive to light. Conjunctivae and EOM are normal.   Cardiovascular: Normal rate and regular rhythm.  No murmur heard. Respiratory: Effort normal and breath sounds normal. No stridor. No respiratory distress. He exhibits tenderness (Left costochondral).  Lymphadenopathy:       Head (right side): No submandibular adenopathy present.       Head (left side): No submandibular adenopathy present.    He has no cervical adenopathy.  Neurological: He is alert and oriented to person, place, and time. He has normal strength.  Skin: Skin is warm. No rash noted. No erythema.  Psychiatric: He has a normal mood and affect. His speech is normal.  Well groomed, good eye contact.      ASSESSMENT AND PLAN:  Mr. Sean was seen  today for cough and nasal congestion.  Diagnoses and all orders for this visit:  URI, acute  Symptoms suggests a viral etiology, I explained that symptomatic treatment is usually recommended in this case, I do not think abx is needed at this time. He will monitor for new onset of fever or worsening symptoms.  No wheezing appreciated today.  I think SOB he describes could be related to nasal congestion. No respiratory distress and normal lung auscultation,so I do not think imaging is needed today.  F/U as needed.   -     fluticasone (FLONASE) 50 MCG/ACT nasal spray; Place 1 spray into both nostrils 2 (two) times daily.  Chest wall pain  Seems musculoskeletal. Pain is exacerbated by coughing and reproducible with palpation of left chest. Instructed about warning signs.  Cough  I also explained that cough and nasal congestion can last a few days and sometimes weeks.  -     benzonatate (TESSALON) 100 MG capsule; Take 2 capsules (200 mg total) by mouth 2 (two) times daily as needed for up to 10 days.    -Mr. CAMP GOPAL is to seek attention immediately if symptoms worsen or to follow if they persist or new concerns arise.       Usbaldo Pannone G. Martin[place], MD  [facility]. [place]  office.

## 2017-08-25 DIAGNOSIS — L570 Actinic keratosis: Secondary | ICD-10-CM | POA: Diagnosis not present

## 2017-08-25 DIAGNOSIS — D044 Carcinoma in situ of skin of scalp and neck: Secondary | ICD-10-CM | POA: Diagnosis not present

## 2017-09-01 DIAGNOSIS — D044 Carcinoma in situ of skin of scalp and neck: Secondary | ICD-10-CM | POA: Diagnosis not present

## 2017-11-24 DIAGNOSIS — Z85828 Personal history of other malignant neoplasm of skin: Secondary | ICD-10-CM | POA: Diagnosis not present

## 2017-11-24 DIAGNOSIS — L570 Actinic keratosis: Secondary | ICD-10-CM | POA: Diagnosis not present

## 2018-03-16 IMAGING — DX DG HIP (WITH OR WITHOUT PELVIS) 1V PORT*R*
2 series · 2 of 2 positions shown · non-contrast
Comparison: None in PACs

CLINICAL DATA: Status post right total hip joint replacement. PACU
images.

EXAM:
DG HIP (WITH OR WITHOUT PELVIS) 1V PORT RIGHT

[hip frog leg]
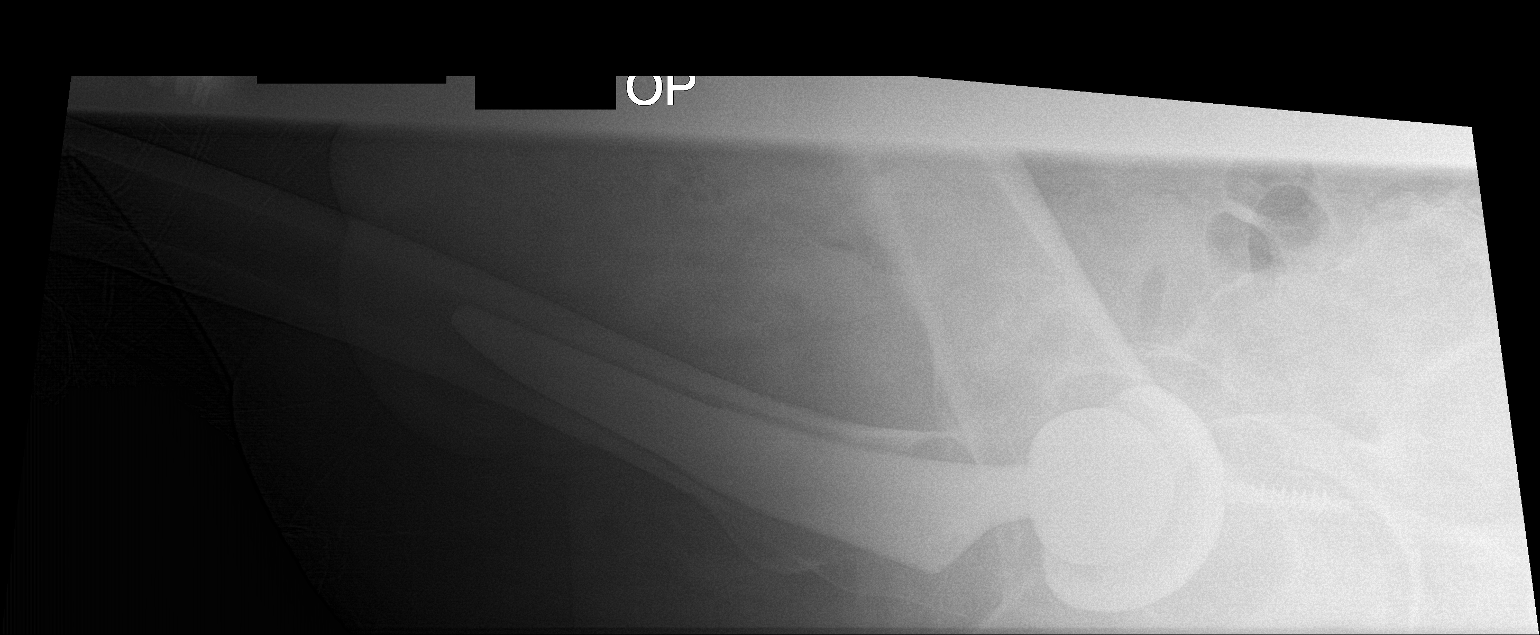

[pelvis ap]
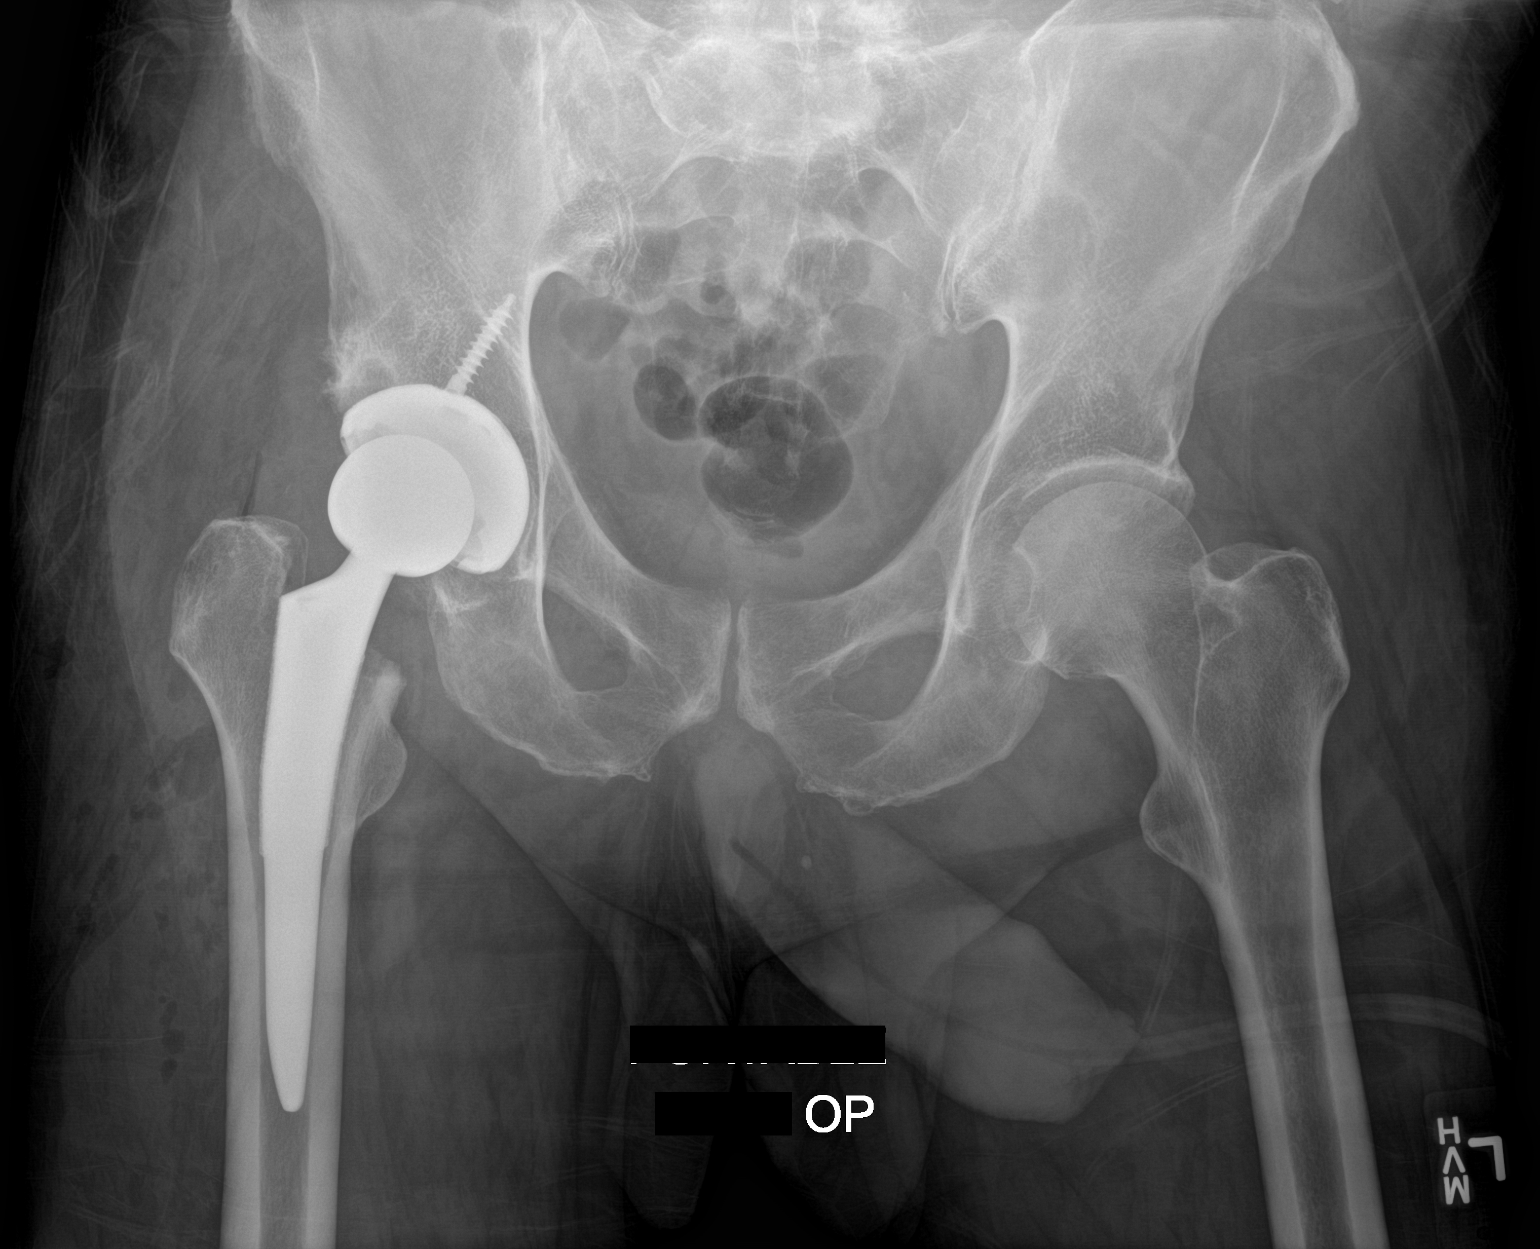

[2 of 2 positions shown; findings below may reference images not displayed]

FINDINGS: The patient has undergone placement of a right total hip joint
prosthesis. Radiographic positioning of the prosthetic components is
good. The interface with the native bone appears normal.
IMPRESSION: No immediate postprocedure complication following right total hip
joint prosthesis placement.

## 2018-03-28 ENCOUNTER — Encounter: Payer: Self-pay | Admitting: Family Medicine

## 2018-03-28 ENCOUNTER — Other Ambulatory Visit: Payer: Self-pay

## 2018-03-28 ENCOUNTER — Ambulatory Visit (INDEPENDENT_AMBULATORY_CARE_PROVIDER_SITE_OTHER): Payer: PPO | Admitting: Family Medicine

## 2018-03-28 VITALS — BP 138/78 | HR 67 | Temp 98.0°F | Ht 68.75 in | Wt 207.1 lb

## 2018-03-28 DIAGNOSIS — J069 Acute upper respiratory infection, unspecified: Secondary | ICD-10-CM | POA: Diagnosis not present

## 2018-03-28 NOTE — Patient Instructions (Signed)
Upper Respiratory Infection, Adult Most upper respiratory infections (URIs) are a viral infection of the air passages leading to the lungs. A URI affects the nose, throat, and upper air passages. The most common type of URI is nasopharyngitis and is typically referred to as "the common cold." URIs run their course and usually go away on their own. Most of the time, a URI does not require medical attention, but sometimes a bacterial infection in the upper airways can follow a viral infection. This is called a secondary infection. Sinus and middle ear infections are common types of secondary upper respiratory infections. Bacterial pneumonia can also complicate a URI. A URI can worsen asthma and chronic obstructive pulmonary disease (COPD). Sometimes, these complications can require emergency medical care and may be life threatening. What are the causes? Almost all URIs are caused by viruses. A virus is a type of germ and can spread from one person to another. What increases the risk? You may be at risk for a URI if:  You smoke.  You have chronic heart or lung disease.  You have a weakened defense (immune) system.  You are very young or very old.  You have nasal allergies or asthma.  You work in crowded or poorly ventilated areas.  You work in health care facilities or schools.  What are the signs or symptoms? Symptoms typically develop 2-3 days after you come in contact with a cold virus. Most viral URIs last 7-10 days. However, viral URIs from the influenza virus (flu virus) can last 14-18 days and are typically more severe. Symptoms may include:  Runny or stuffy (congested) nose.  Sneezing.  Cough.  Sore throat.  Headache.  Fatigue.  Fever.  Loss of appetite.  Pain in your forehead, behind your eyes, and over your cheekbones (sinus pain).  Muscle aches.  How is this diagnosed? Your health care provider may diagnose a URI by:  Physical exam.  Tests to check that your  symptoms are not due to another condition such as: ? Strep throat. ? Sinusitis. ? Pneumonia. ? Asthma.  How is this treated? A URI goes away on its own with time. It cannot be cured with medicines, but medicines may be prescribed or recommended to relieve symptoms. Medicines may help:  Reduce your fever.  Reduce your cough.  Relieve nasal congestion.  Follow these instructions at home:  Take medicines only as directed by your health care provider.  Gargle warm saltwater or take cough drops to comfort your throat as directed by your health care provider.  Use a warm mist humidifier or inhale steam from a shower to increase air moisture. This may make it easier to breathe.  Drink enough fluid to keep your urine clear or pale yellow.  Eat soups and other clear broths and maintain good nutrition.  Rest as needed.  Return to work when your temperature has returned to normal or as your health care provider advises. You may need to stay home longer to avoid infecting others. You can also use a face mask and careful hand washing to prevent spread of the virus.  Increase the usage of your inhaler if you have asthma.  Do not use any tobacco products, including cigarettes, chewing tobacco, or electronic cigarettes. If you need help quitting, ask your health care provider. How is this prevented? The best way to protect yourself from getting a cold is to practice good hygiene.  Avoid oral or hand contact with people with cold symptoms.  Wash your   hands often if contact occurs.  There is no clear evidence that vitamin C, vitamin E, echinacea, or exercise reduces the chance of developing a cold. However, it is always recommended to get plenty of rest, exercise, and practice good nutrition. Contact a health care provider if:  You are getting worse rather than better.  Your symptoms are not controlled by medicine.  You have chills.  You have worsening shortness of breath.  You have  brown or red mucus.  You have yellow or brown nasal discharge.  You have pain in your face, especially when you bend forward.  You have a fever.  You have swollen neck glands.  You have pain while swallowing.  You have white areas in the back of your throat. Get help right away if:  You have severe or persistent: ? Headache. ? Ear pain. ? Sinus pain. ? Chest pain.  You have chronic lung disease and any of the following: ? Wheezing. ? Prolonged cough. ? Coughing up blood. ? A change in your usual mucus.  You have a stiff neck.  You have changes in your: ? Vision. ? Hearing. ? Thinking. ? Mood. This information is not intended to replace advice given to you by your health care provider. Make sure you discuss any questions you have with your health care provider. Document Released: 09/28/2000 Document Revised: 12/06/2015 Document Reviewed: 07/10/2013 Elsevier Interactive Patient Education  2018 [employer]  

## 2018-03-28 NOTE — Progress Notes (Deleted)
  Subjective:     Patient ID: Stephen Woods, male   DOB: [birth year], 67 y.o.   MRN: [id number]  HPI   Review of Systems     Objective:   Physical Exam     Assessment:     ***    Plan:     ***

## 2018-03-28 NOTE — Progress Notes (Signed)
  Subjective:     Patient ID: Stephen Woods, male   DOB: Aug 19, 1950, 67 y.o.   MRN: [id number]  HPI Patient is seen with URI symptoms.  Onset about a week ago.  Has had mostly nasal congestion.  No cough.  Mild body aches.  Increased fatigue.  He has had some chills intermittently but no documented fever.  No nausea or vomiting.  Was out of work today but otherwise has been working.  Past Medical History:  Diagnosis Date  . Arthritis   . Erectile dysfunction   . Hypertension    Past Surgical History:  Procedure Laterality Date  . NO PAST SURGERIES    . TOTAL HIP ARTHROPLASTY Right 05/10/2016   Procedure: RIGHT TOTAL HIP ARTHROPLASTY ANTERIOR APPROACH;  Surgeon: Paralee Cancel, MD;  Location: [facility];  Service: Orthopedics;  Laterality: Right;    reports that he has never smoked. His smokeless tobacco use includes snuff. He reports that he drinks about 7.0 standard drinks of alcohol per week. He reports that he does not use drugs. family history includes Arthritis in his mother; Diabetes in his brother; Heart disease (age of onset: 104) in his father; Heart disease (age of onset: 61) in his mother; Heart disease (age of onset: 52) in his brother; Hypertension in his father; Stroke in his father. Allergies  Allergen Reactions  . Ampicillin Hives    hives     Review of Systems  Constitutional: Positive for chills and fatigue. Negative for fever.  HENT: Positive for congestion, sinus pressure and sinus pain.   Respiratory: Negative for cough.        Objective:   Physical Exam  Constitutional: He appears well-developed and well-nourished.  HENT:  Mouth/Throat: Oropharynx is clear and moist.  Neck: Neck supple.  Cardiovascular: Normal rate and regular rhythm.  Pulmonary/Chest: Effort normal and breath sounds normal.  Lymphadenopathy:    He has no cervical adenopathy.       Assessment:     Viral URI.  Nonfocal exam.    Plan:     -Recommend plenty of fluids and rest.  Work  note was written for today. -Consider over-the-counter plain Mucinex.  No indication for antibiotics at this time. -Follow-up for any fever or worsening symptoms  Stephen Post MD [facility]

## 2018-05-31 DIAGNOSIS — Z471 Aftercare following joint replacement surgery: Secondary | ICD-10-CM | POA: Diagnosis not present

## 2018-05-31 DIAGNOSIS — Z96641 Presence of right artificial hip joint: Secondary | ICD-10-CM | POA: Diagnosis not present

## 2018-07-09 ENCOUNTER — Other Ambulatory Visit: Payer: Self-pay

## 2018-07-09 ENCOUNTER — Telehealth: Payer: Self-pay | Admitting: Family Medicine

## 2018-07-09 MED ORDER — LISINOPRIL 10 MG PO TABS: 10.0000 mg | ORAL_TABLET | Freq: Every day | ORAL | 0 refills | Status: DC

## 2018-07-09 MED ORDER — ATORVASTATIN CALCIUM 20 MG PO TABS: 20.0000 mg | ORAL_TABLET | Freq: Every day | ORAL | 0 refills | Status: DC

## 2018-07-09 NOTE — Telephone Encounter (Signed)
Copied from [place] [id number]. Topic: Quick Communication - Rx Refill/Question >> Jul 09, 2018  9:57 AM Margot Ables wrote: Medication: lisinopril (PRINIVIL,ZESTRIL) 10 MG tablet & atorvastatin (LIPITOR) 20 MG tablet. Pt will be out 10 days. Pt moved appt to 09/14/2018 due to [place]. Please advise  Has the patient contacted their pharmacy? No - RS appt and requesting med refills Preferred Pharmacy (with phone number or street name): [place], [place] [phone] (Phone) [phone] (Fax)

## 2018-07-09 NOTE — Telephone Encounter (Signed)
A 90 day refill of the 2 medications listed have been sent to the [facility] on [address] for the patient.

## 2018-07-17 ENCOUNTER — Encounter: Payer: PPO | Admitting: Family Medicine

## 2018-07-20 ENCOUNTER — Encounter: Payer: PPO | Admitting: Family Medicine

## 2018-09-14 ENCOUNTER — Encounter: Payer: PPO | Admitting: Family Medicine

## 2018-11-02 ENCOUNTER — Encounter: Payer: PPO | Admitting: Family Medicine

## 2018-11-05 ENCOUNTER — Other Ambulatory Visit: Payer: Self-pay | Admitting: Family Medicine

## 2018-11-05 NOTE — Telephone Encounter (Signed)
Patient has an appointment on 11/16/18 and can be filled at that time since he cancelled appointment in May

## 2018-11-06 ENCOUNTER — Telehealth: Payer: Self-pay | Admitting: Family Medicine

## 2018-11-06 NOTE — Telephone Encounter (Signed)
I think he should be OK/safe to keep this.  But he has to be comfortable with that decision.  Re-iterate we will have masks on and do what we can to keep him safe.  If he decides to delay- no problem.

## 2018-11-06 NOTE — Telephone Encounter (Signed)
The patients wife called in to reschedule her CPE and was wanting to know if Stephen Woods should reschedule his CPE do to him being high risk. The patient also needs refills on his medication. The wife would like a call back to give them advise on what the provider suggest that they do.   Please Advise

## 2018-11-06 NOTE — Telephone Encounter (Signed)
Please advise if OK to keep CPE on 11/16/18

## 2018-11-07 NOTE — Telephone Encounter (Signed)
Called patient and spoke to his wife and he has set up a [facility]oxy visit for 11/14/18 for medication refill and a lab visit on 11/15/18. He does not feel comfortable coming in for physical at this time.

## 2018-11-13 ENCOUNTER — Other Ambulatory Visit: Payer: Self-pay

## 2018-11-13 MED ORDER — ATORVASTATIN CALCIUM 20 MG PO TABS: 20.0000 mg | ORAL_TABLET | Freq: Every day | ORAL | 0 refills | Status: DC

## 2018-11-14 ENCOUNTER — Ambulatory Visit (INDEPENDENT_AMBULATORY_CARE_PROVIDER_SITE_OTHER): Payer: PPO | Admitting: Family Medicine

## 2018-11-14 ENCOUNTER — Telehealth: Payer: Self-pay

## 2018-11-14 ENCOUNTER — Other Ambulatory Visit: Payer: Self-pay

## 2018-11-14 DIAGNOSIS — I10 Essential (primary) hypertension: Secondary | ICD-10-CM | POA: Diagnosis not present

## 2018-11-14 DIAGNOSIS — E785 Hyperlipidemia, unspecified: Secondary | ICD-10-CM

## 2018-11-14 DIAGNOSIS — N529 Male erectile dysfunction, unspecified: Secondary | ICD-10-CM

## 2018-11-14 MED ORDER — ATORVASTATIN CALCIUM 20 MG PO TABS: 20.0000 mg | ORAL_TABLET | Freq: Every day | ORAL | 3 refills | Status: DC

## 2018-11-14 MED ORDER — SILDENAFIL CITRATE 20 MG PO TABS: ORAL_TABLET | ORAL | 3 refills | Status: DC

## 2018-11-14 MED ORDER — LISINOPRIL 10 MG PO TABS: 10.0000 mg | ORAL_TABLET | Freq: Every day | ORAL | 3 refills | Status: DC

## 2018-11-14 NOTE — Progress Notes (Signed)
Patient ID: Stephen Woods, male   DOB: July 09, 1950, 68 y.o.   MRN: [id number]   This visit type was conducted due to national recommendations for restrictions regarding the COVID-19 pandemic in an effort to limit this patient's exposure and mitigate transmission in our community.   Virtual Visit via Video Note  I connected with Stephen Woods on 11/14/18 at  9:30 AM EDT by a video enabled telemedicine application and verified that I am speaking with the correct person using two identifiers.  Location patient: home Location provider:work or home office Persons participating in the virtual visit: patient, provider  I discussed the limitations of evaluation and management by telemedicine and the availability of in person appointments. The patient expressed understanding and agreed to proceed.   HPI: Patient has history of hypertension, hyperlipidemia, osteoarthritis, erectile dysfunction.  Requesting refills of medication which include atorvastatin, lisinopril, and generic Viagra.  He is doing generally well.  No recent headaches, dizziness, chest pains.  No dyspnea.  No cough or fever.  Denies any side effects of medications.  Compliant with medications.  Overdue for labs.  He has future lab appointment for tomorrow.  Blood pressure controlled by home readings.  He had reading earlier today 117/78  Denies any depression issues.  No recent falls.   ROS: See pertinent positives and negatives per HPI.  Past Medical History:  Diagnosis Date  . Arthritis   . Erectile dysfunction   . Hypertension     Past Surgical History:  Procedure Laterality Date  . NO PAST SURGERIES    . TOTAL HIP ARTHROPLASTY Right 05/10/2016   Procedure: RIGHT TOTAL HIP ARTHROPLASTY ANTERIOR APPROACH;  Surgeon: Paralee Cancel, MD;  Location: [facility];  Service: Orthopedics;  Laterality: Right;    Family History  Problem Relation Age of Onset  . Arthritis Mother   . Heart disease Mother 81  . Heart disease Father 84   . Hypertension Father   . Stroke Father   . Diabetes Brother        type 2  . Heart disease Brother 33       CAD    SOCIAL HX: Non-smoker   Current Outpatient Medications:  .  aspirin 81 MG chewable tablet, Chew 1 tablet (81 mg total) by mouth 2 (two) times daily. Take for 4 weeks., Disp: 60 tablet, Rfl: 0 .  atorvastatin (LIPITOR) 20 MG tablet, Take 1 tablet (20 mg total) by mouth daily., Disp: 90 tablet, Rfl: 3 .  Calcium Carbonate-Vitamin D3 (CALCIUM 600-D) 600-400 MG-UNIT TABS, Take 1 tablet by mouth daily., Disp: , Rfl:  .  Cholecalciferol (VITAMIN D-3) 5000 units TABS, Take 1 tablet by mouth daily., Disp: , Rfl:  .  docusate sodium (COLACE) 100 MG capsule, Take 1 capsule (100 mg total) by mouth 2 (two) times daily., Disp: 10 capsule, Rfl: 0 .  fluticasone (FLONASE) 50 MCG/ACT nasal spray, Place 1 spray into both nostrils 2 (two) times daily., Disp: 16 g, Rfl: 0 .  Ginkgo Biloba 40 MG TABS, Take 2 tablets by mouth daily., Disp: , Rfl:  .  lisinopril (ZESTRIL) 10 MG tablet, Take 1 tablet (10 mg total) by mouth daily., Disp: 90 tablet, Rfl: 3 .  magnesium oxide (MAG-OX) 400 MG tablet, Take 400 mg by mouth daily., Disp: , Rfl:  .  Misc Natural Products (GLUCOSAMINE CHOND COMPLEX/MSM PO), Take 2 tablets by mouth daily., Disp: , Rfl:  .  Multiple Vitamins-Minerals (MULTIVITAMIN WITH MINERALS) tablet, Take 1 tablet by mouth daily., Disp: ,  Rfl:  .  Omega-3 Fatty Acids (FISH OIL) 1000 MG CAPS, Take 1 capsule by mouth daily., Disp: , Rfl:  .  polyethylene glycol (MIRALAX / GLYCOLAX) packet, Take 17 g by mouth 2 (two) times daily., Disp: 14 each, Rfl: 0 .  sildenafil (REVATIO) 20 MG tablet, Take 2 to 5 tablets one hour prior to sexual activity, Disp: 40 tablet, Rfl: 3 .  zinc gluconate 50 MG tablet, Take 50 mg by mouth daily., Disp: , Rfl:   EXAM:  VITALS per patient if applicable:  GENERAL: alert, oriented, appears well and in no acute distress  HEENT: atraumatic, conjunttiva clear,  no obvious abnormalities on inspection of external nose and ears  NECK: normal movements of the head and neck  LUNGS: on inspection no signs of respiratory distress, breathing rate appears normal, no obvious gross SOB, gasping or wheezing  CV: no obvious cyanosis  MS: moves all visible extremities without noticeable abnormality  PSYCH/NEURO: pleasant and cooperative, no obvious depression or anxiety, speech and thought processing grossly intact  ASSESSMENT AND PLAN:  Discussed the following assessment and plan:  Essential hypertension - Plan: Basic metabolic panel  Hyperlipidemia, unspecified hyperlipidemia type - Plan: Lipid panel, Hepatic function panel  Erectile dysfunction, unspecified erectile dysfunction type  -Refilled medications including Lipitor, lisinopril, and sildenafil for 1 year   I discussed the assessment and treatment plan with the patient. The patient was provided an opportunity to ask questions and all were answered. The patient agreed with the plan and demonstrated an understanding of the instructions.   The patient was advised to call back or seek an in-person evaluation if the symptoms worsen or if the condition fails to improve as anticipated.   Carolann Littler, MD

## 2018-11-14 NOTE — Telephone Encounter (Signed)
Patient asked to have his Sildenafil mailed to his home address. This has just been placed in the mail.

## 2018-11-15 ENCOUNTER — Other Ambulatory Visit (INDEPENDENT_AMBULATORY_CARE_PROVIDER_SITE_OTHER): Payer: PPO

## 2018-11-15 ENCOUNTER — Other Ambulatory Visit: Payer: Self-pay

## 2018-11-15 DIAGNOSIS — E785 Hyperlipidemia, unspecified: Secondary | ICD-10-CM

## 2018-11-15 DIAGNOSIS — I10 Essential (primary) hypertension: Secondary | ICD-10-CM | POA: Diagnosis not present

## 2018-11-15 LAB — HEPATIC FUNCTION PANEL
ALT: 42 U/L (ref 0–53)
AST: 40 U/L — ABNORMAL HIGH (ref 0–37)
Albumin: 4.3 g/dL (ref 3.5–5.2)
Alkaline Phosphatase: 51 U/L (ref 39–117)
Bilirubin, Direct: 0.2 mg/dL (ref 0.0–0.3)
Total Bilirubin: 0.7 mg/dL (ref 0.2–1.2)
Total Protein: 7.2 g/dL (ref 6.0–8.3)

## 2018-11-15 LAB — BASIC METABOLIC PANEL
BUN: 20 mg/dL (ref 6–23)
CO2: 28 mEq/L (ref 19–32)
Calcium: 9.5 mg/dL (ref 8.4–10.5)
Chloride: 102 mEq/L (ref 96–112)
Creatinine, Ser: 0.85 mg/dL (ref 0.40–1.50)
GFR: 89.71 mL/min (ref >60.00)
Glucose, Bld: 120 mg/dL — ABNORMAL HIGH (ref 70–99)
Potassium: 4.9 mEq/L (ref 3.5–5.1)
Sodium: 138 mEq/L (ref 135–145)

## 2018-11-15 LAB — LIPID PANEL
Cholesterol: 141 mg/dL (ref 0–200)
HDL: 42.1 mg/dL (ref >39.00)
LDL Cholesterol: 70 mg/dL (ref 0–99)
NonHDL: 98.72
Total CHOL/HDL Ratio: 3
Triglycerides: 144 mg/dL (ref 0.0–149.0)
VLDL: 28.8 mg/dL (ref 0.0–40.0)

## 2018-11-16 ENCOUNTER — Ambulatory Visit: Payer: PPO | Admitting: Family Medicine

## 2019-03-26 ENCOUNTER — Ambulatory Visit (INDEPENDENT_AMBULATORY_CARE_PROVIDER_SITE_OTHER): Payer: PPO

## 2019-03-26 VITALS — BP 185/75 | Ht 69.0 in | Wt 218.0 lb

## 2019-03-26 DIAGNOSIS — Z0000 Encounter for general adult medical examination without abnormal findings: Secondary | ICD-10-CM

## 2019-03-26 NOTE — Patient Instructions (Addendum)
Stephen Woods , Thank you for taking time to participate in your Medicare Wellness Visit. I appreciate your ongoing commitment to your health goals. Please review the following plan we discussed and let me know if I can assist you in the future.   Screening recommendations/referrals: Colorectal Screening: STRONGLY RECOMMENDED. Patient states he would like to wait until pandemic situation improves before being referred.  Vision and Dental Exams: Recommended annual ophthalmology exams for early detection of glaucoma and other disorders of the eye Recommended annual dental exams for proper oral hygiene.  Patient denies having any problems with vision.  Diabetic Exams: Diabetic Eye Exam: N/A Diabetic Foot Exam: N/A  Vaccinations: Influenza vaccine: Completed 02/04/2019; due again Fall 2021 Pneumococcal vaccine: Completed 07/14/2017; up to date Tdap vaccine: completed 07/14/2017; up to date Shingles vaccine: Please contact your pharmacy to determine your out of pocket expense for the Shingrix vaccine. You may receive this vaccine at your local pharmacy.  Advanced directives: Advance directives discussed with you today.  Please bring a copy of your POA (Power of [place]) and/or Living Will to your next appointment.  Goals: Recommend to drink at least 6-8 8oz glasses of water per day. Recommend to decrease portion sizes by eating 3 small healthy meals and at least 2 healthy snacks per day and follow a low sodium diet.   Next appointment: Please schedule your Annual Wellness Visit with your Nurse Health Advisor in one year.  Preventive Care 68 Years and Older, Male Preventive care refers to lifestyle choices and visits with your health care provider that can promote health and wellness. What does preventive care include?  A yearly physical exam. This is also called an annual well check.  Dental exams once or twice a year.  Routine eye exams. Ask your health care provider how often you should  have your eyes checked.  Personal lifestyle choices, including:  Daily care of your teeth and gums.  Regular physical activity.  Eating a healthy diet.  Avoiding tobacco and drug use.  Limiting alcohol use.  Practicing safe sex.  Taking low doses of aspirin every day if recommended by your health care provider..  Taking vitamin and mineral supplements as recommended by your health care provider. What happens during an annual well check? The services and screenings done by your health care provider during your annual well check will depend on your age, overall health, lifestyle risk factors, and family history of disease. Counseling  Your health care provider may ask you questions about your:  Alcohol use.  Tobacco use.  Drug use.  Emotional well-being.  Home and relationship well-being.  Sexual activity.  Eating habits.  History of falls.  Memory and ability to understand (cognition).  Work and work Statistician. Screening  You may have the following tests or measurements:  Height, weight, and BMI.  Blood pressure.  Lipid and cholesterol levels. These may be checked every 5 years, or more frequently if you are over 68 years old.  Skin check.  Lung cancer screening. You may have this screening every year starting at age 68 if you have a 30-pack-year history of smoking and currently smoke or have quit within the past 15 years.  Fecal occult blood test (FOBT) of the stool. You may have this test every year starting at age 68.  Flexible sigmoidoscopy or colonoscopy. You may have a sigmoidoscopy every 5 years or a colonoscopy every 10 years starting at age 68.  Prostate cancer screening. Recommendations will vary depending on your family  history and other risks.  Hepatitis C blood test.  Hepatitis B blood test.  Sexually transmitted disease (STD) testing.  Diabetes screening. This is done by checking your blood sugar (glucose) after you have not eaten for  a while (fasting). You may have this done every 1-3 years.  Abdominal aortic aneurysm (AAA) screening. You may need this if you are a current or former smoker.  Osteoporosis. You may be screened starting at age 68 if you are at high risk. Talk with your health care provider about your test results, treatment options, and if necessary, the need for more tests. Vaccines  Your health care provider may recommend certain vaccines, such as:  Influenza vaccine. This is recommended every year.  Tetanus, diphtheria, and acellular pertussis (Tdap, Td) vaccine. You may need a Td booster every 10 years.  Zoster vaccine. You may need this after age 68.  Pneumococcal 13-valent conjugate (PCV13) vaccine. One dose is recommended after age 68.  Pneumococcal polysaccharide (PPSV23) vaccine. One dose is recommended after age 68. Talk to your health care provider about which screenings and vaccines you need and how often you need them. This information is not intended to replace advice given to you by your health care provider. Make sure you discuss any questions you have with your health care provider. Document Released: 05/01/2015 Document Revised: 12/23/2015 Document Reviewed: 02/03/2015 Elsevier Interactive Patient Education  2017 [place] Prevention in the Home Falls can cause injuries. They can happen to people of all ages. There are many things you can do to make your home safe and to help prevent falls. What can I do on the outside of my home?  Regularly fix the edges of walkways and driveways and fix any cracks.  Remove anything that might make you trip as you walk through a door, such as a raised step or threshold.  Trim any bushes or trees on the path to your home.  Use bright outdoor lighting.  Clear any walking paths of anything that might make someone trip, such as rocks or tools.  Regularly check to see if handrails are loose or broken. Make sure that both sides of any  steps have handrails.  Any raised decks and porches should have guardrails on the edges.  Have any leaves, snow, or ice cleared regularly.  Use sand or salt on walking paths during winter.  Clean up any spills in your garage right away. This includes oil or grease spills. What can I do in the bathroom?  Use night lights.  Install grab bars by the toilet and in the tub and shower. Do not use towel bars as grab bars.  Use non-skid mats or decals in the tub or shower.  If you need to sit down in the shower, use a plastic, non-slip stool.  Keep the floor dry. Clean up any water that spills on the floor as soon as it happens.  Remove soap buildup in the tub or shower regularly.  Attach bath mats securely with double-sided non-slip rug tape.  Do not have throw rugs and other things on the floor that can make you trip. What can I do in the bedroom?  Use night lights.  Make sure that you have a light by your bed that is easy to reach.  Do not use any sheets or blankets that are too big for your bed. They should not hang down onto the floor.  Have a firm chair that has side arms. You can use  this for support while you get dressed.  Do not have throw rugs and other things on the floor that can make you trip. What can I do in the kitchen?  Clean up any spills right away.  Avoid walking on wet floors.  Keep items that you use a lot in easy-to-reach places.  If you need to reach something above you, use a strong step stool that has a grab bar.  Keep electrical cords out of the way.  Do not use floor polish or wax that makes floors slippery. If you must use wax, use non-skid floor wax.  Do not have throw rugs and other things on the floor that can make you trip. What can I do with my stairs?  Do not leave any items on the stairs.  Make sure that there are handrails on both sides of the stairs and use them. Fix handrails that are broken or loose. Make sure that handrails are  as long as the stairways.  Check any carpeting to make sure that it is firmly attached to the stairs. Fix any carpet that is loose or worn.  Avoid having throw rugs at the top or bottom of the stairs. If you do have throw rugs, attach them to the floor with carpet tape.  Make sure that you have a light switch at the top of the stairs and the bottom of the stairs. If you do not have them, ask someone to add them for you. What else can I do to help prevent falls?  Wear shoes that:  Do not have high heels.  Have rubber bottoms.  Are comfortable and fit you well.  Are closed at the toe. Do not wear sandals.  If you use a stepladder:  Make sure that it is fully opened. Do not climb a closed stepladder.  Make sure that both sides of the stepladder are locked into place.  Ask someone to hold it for you, if possible.  Clearly mark and make sure that you can see:  Any grab bars or handrails.  First and last steps.  Where the edge of each step is.  Use tools that help you move around (mobility aids) if they are needed. These include:  Canes.  Walkers.  Scooters.  Crutches.  Turn on the lights when you go into a dark area. Replace any light bulbs as soon as they burn out.  Set up your furniture so you have a clear path. Avoid moving your furniture around.  If any of your floors are uneven, fix them.  If there are any pets around you, be aware of where they are.  Review your medicines with your doctor. Some medicines can make you feel dizzy. This can increase your chance of falling. Ask your doctor what other things that you can do to help prevent falls. This information is not intended to replace advice given to you by your health care provider. Make sure you discuss any questions you have with your health care provider. Document Released: 01/29/2009 Document Revised: 09/10/2015 Document Reviewed: 05/09/2014 Elsevier Interactive Patient Education  2017 [employer].

## 2019-03-26 NOTE — Progress Notes (Signed)
This visit is being conducted via phone call due to the COVID-19 pandemic. This patient has given me verbal consent via phone to conduct this visit, patient states they are participating from their home address. Some vital signs may be absent or patient reported.   Patient identification: identified by name, DOB, and current address.  Location provider: [facility], Office Persons participating in the virtual visit: Mr. Thorsten Benninghoff and Franne Forts, LPN.   Subjective:   Stephen Woods is a 68 y.o. male who presents for Medicare Annual/Subsequent preventive examination.  Review of Systems:  Cardiac Risk Factors include: advanced age (>3men, >13 women);hypertension;male gender;sedentary lifestyle     Objective:    Vitals: BP (!) 185/75 Comment: patient reported  Ht 5\' 9"  (1.753 m)   Wt 218 lb (98.9 kg)   BMI 32.19 kg/m   Body mass index is 32.19 kg/m.  Advanced Directives 03/26/2019 05/10/2016 05/10/2016 05/05/2016  Does Patient Have a Medical Advance Directive? Yes No No No  Type of Paramedic of [place];Living will - - -  Does patient want to make changes to medical advance directive? No - Patient declined - - -  Copy of [place] in Chart? No - copy requested - - -  Would patient like information on creating a medical advance directive? - No - Patient declined No - Patient declined Yes (MAU/Ambulatory/Procedural Areas - Information given)    Tobacco Social History   Tobacco Use  Smoking Status Never Smoker  Smokeless Tobacco Current User  . Types: Snuff     Ready to quit: Not Answered Counseling given: Not Answered   Clinical Intake:  Pre-visit preparation completed: Yes  Pain : 0-10 Pain Score: 3  Pain Location: Shoulder Pain Orientation: Right                 Past Medical History:  Diagnosis Date  . Arthritis   . Erectile dysfunction   . Hypertension    Past Surgical History:  Procedure Laterality  Date  . NO PAST SURGERIES    . TOTAL HIP ARTHROPLASTY Right 05/10/2016   Procedure: RIGHT TOTAL HIP ARTHROPLASTY ANTERIOR APPROACH;  Surgeon: Paralee Cancel, MD;  Location: [facility];  Service: Orthopedics;  Laterality: Right;   Family History  Problem Relation Age of Onset  . Arthritis Mother   . Heart disease Mother 56  . Heart disease Father 49  . Hypertension Father   . Stroke Father   . Diabetes Brother        type 2  . Heart disease Brother 89       CAD   Social History   Socioeconomic History  . Marital status: Married    Spouse name: Not on file  . Number of children: 1  . Years of education: 62  . Highest education level: Some college, no degree  Occupational History  . Occupation: retired  Scientific laboratory technician  . Financial resource strain: Not very hard  . Food insecurity    Worry: Never true    Inability: Never true  . Transportation needs    Medical: Yes    Non-medical: Yes  Tobacco Use  . Smoking status: Never Smoker  . Smokeless tobacco: Current User    Types: Snuff  Substance and Sexual Activity  . Alcohol use: Yes    Alcohol/week: 7.0 standard drinks    Types: 7 Glasses of wine per week    Comment: 1 glass with dinner  . Drug use: No  .  Sexual activity: Not on file  Lifestyle  . Physical activity    Days per week: 0 days    Minutes per session: 0 min  . Stress: Not at all  Relationships  . Social connections    Talks on phone: More than three times a week    Gets together: More than three times a week    Attends religious service: Not on file    Active member of club or organization: Not on file    Attends meetings of clubs or organizations: Not on file    Relationship status: Married  Other Topics Concern  . Not on file  Social History Narrative   Retired and married   1 dog and 1 cat    Outpatient Encounter Medications as of 03/26/2019  Medication Sig  . aspirin 81 MG chewable tablet Chew 1 tablet (81 mg total) by mouth 2 (two) times daily. Take  for 4 weeks.  Marland Kitchen atorvastatin (LIPITOR) 20 MG tablet Take 1 tablet (20 mg total) by mouth daily.  . Calcium Carbonate-Vitamin D3 (CALCIUM 600-D) 600-400 MG-UNIT TABS Take 1 tablet by mouth daily.  . Cholecalciferol (VITAMIN D-3) 5000 units TABS Take 1 tablet by mouth daily.  Marland Kitchen docusate sodium (COLACE) 100 MG capsule Take 1 capsule (100 mg total) by mouth 2 (two) times daily.  . fluticasone (FLONASE) 50 MCG/ACT nasal spray Place 1 spray into both nostrils 2 (two) times daily.  . Ginkgo Biloba 40 MG TABS Take 2 tablets by mouth daily.  Marland Kitchen lisinopril (ZESTRIL) 10 MG tablet Take 1 tablet (10 mg total) by mouth daily.  . magnesium oxide (MAG-OX) 400 MG tablet Take 400 mg by mouth daily.  . Misc Natural Products (GLUCOSAMINE CHOND COMPLEX/MSM PO) Take 2 tablets by mouth daily.  . Multiple Vitamins-Minerals (MULTIVITAMIN WITH MINERALS) tablet Take 1 tablet by mouth daily.  . Omega-3 Fatty Acids (FISH OIL) 1000 MG CAPS Take 1 capsule by mouth daily.  . polyethylene glycol (MIRALAX / GLYCOLAX) packet Take 17 g by mouth 2 (two) times daily.  . sildenafil (REVATIO) 20 MG tablet Take 2 to 5 tablets one hour prior to sexual activity  . zinc gluconate 50 MG tablet Take 50 mg by mouth daily.  Marland Kitchen HYDROcodone-acetaminophen (NORCO) 7.5-325 MG tablet Take 1 tablet by mouth every 4 (four) hours as needed.   No facility-administered encounter medications on file as of 03/26/2019.     Activities of Daily Living In your present state of health, do you have any difficulty performing the following activities: 03/26/2019  Hearing? N  Vision? N  Difficulty concentrating or making decisions? N  Walking or climbing stairs? N  Dressing or bathing? N  Doing errands, shopping? N  Preparing Food and eating ? N  Using the Toilet? N  In the past six months, have you accidently leaked urine? N  Do you have problems with loss of bowel control? N  Managing your Medications? N  Managing your Finances? N  Housekeeping or  managing your Housekeeping? N  Some recent data might be hidden    Patient Care Team: Eulas Post, MD as PCP - General (Family Medicine)   Assessment:   This is a routine wellness examination for Stephen Woods.  Exercise Activities and Dietary recommendations Current Exercise Habits: The patient does not participate in regular exercise at present, Exercise limited by: None identified  Goals    . Patient Stated     He would like to continue to avoid covid and to have  his right shoulder surgery        Fall Risk Fall Risk  03/26/2019 07/14/2017 07/05/2016 04/05/2016  Falls in the past year? 0 No No No  Risk for fall due to : Medication side effect - - -  Follow up Falls evaluation completed;Education provided;Falls prevention discussed - - -   Is the patient's home free of loose throw rugs in walkways, pet beds, electrical cords, etc?   yes      Grab bars in the bathroom? yes      Handrails on the stairs?   yes      Adequate lighting?   yes  Timed Get Up and Go Performed: N/A due to telephone visit  Depression Screen PHQ 2/9 Scores 03/26/2019 07/14/2017 07/05/2016 04/05/2016  PHQ - 2 Score 0 0 0 0    Cognitive Function Cognitive Testing  Alert? Yes         Normal Appearance? N/A due to telephone visit Oriented to person? Yes           Place? Yes  Time? Yes  Recall of three objects? Yes  Can perform simple calculations? Yes  Displays appropriate judgment? Yes   No cognitive concerns at this time.         Immunization History  Administered Date(s) Administered  . Influenza, High Dose Seasonal PF 02/04/2019  . Pneumococcal Conjugate-13 07/05/2016  . Pneumococcal Polysaccharide-23 07/14/2017  . Td 07/14/2017  . Tdap 04/19/2007, 07/14/2017    Qualifies for Shingles Vaccine? Yes, patient will check with pharmacy for cost.  Screening Tests Health Maintenance  Topic Date Due  . COLONOSCOPY  02/16/2001  . INFLUENZA VACCINE  11/17/2018  . TETANUS/TDAP  07/15/2027  .  Hepatitis C Screening  Completed  . PNA vac Low Risk Adult  Completed   Cancer Screenings: Lung: Low Dose CT Chest recommended if Age 11-80 years, 30 pack-year currently smoking OR have quit w/in 15years. Patient does not qualify. Colorectal: Recommended and he wishes to wait until improvement in covid pandemic.  Additional Screenings:  Hepatitis C Screening: Completed 07/14/2017      Plan:    Today, we discussed need for colon cancer screening and he wishes to wait until an improvement in covid pandemic and then will go to gastroenterology. He will also check with pharmacy about cost for Shingrix.   I have personally reviewed and noted the following in the patient's chart:   . Medical and social history . Use of alcohol, tobacco or illicit drugs  . Current medications and supplements . Functional ability and status . Nutritional status . Physical activity . Advanced directives . List of other physicians . Hospitalizations, surgeries, and ER visits in previous 12 months . Vitals . Screenings to include cognitive, depression, and falls . Referrals and appointments  In addition, I have reviewed and discussed with patient certain preventive protocols, quality metrics, and best practice recommendations. A written personalized care plan for preventive services as well as general preventive health recommendations were provided to patient.     Franne Forts, LPN  [id number]

## 2019-05-21 ENCOUNTER — Encounter: Payer: Self-pay | Admitting: Family Medicine

## 2019-06-20 ENCOUNTER — Other Ambulatory Visit: Payer: Self-pay

## 2019-06-21 ENCOUNTER — Ambulatory Visit (INDEPENDENT_AMBULATORY_CARE_PROVIDER_SITE_OTHER): Payer: PPO | Admitting: Family Medicine

## 2019-06-21 ENCOUNTER — Other Ambulatory Visit: Payer: Self-pay

## 2019-06-21 ENCOUNTER — Encounter: Payer: Self-pay | Admitting: Family Medicine

## 2019-06-21 VITALS — BP 126/78 | HR 77 | Temp 98.2°F | Ht 67.75 in | Wt 224.2 lb

## 2019-06-21 DIAGNOSIS — Z0000 Encounter for general adult medical examination without abnormal findings: Secondary | ICD-10-CM

## 2019-06-21 LAB — LIPID PANEL
Cholesterol: 138 mg/dL (ref 0–200)
HDL: 42.2 mg/dL (ref >39.00)
LDL Cholesterol: 77 mg/dL (ref 0–99)
NonHDL: 96.17
Total CHOL/HDL Ratio: 3
Triglycerides: 97 mg/dL (ref 0.0–149.0)
VLDL: 19.4 mg/dL (ref 0.0–40.0)

## 2019-06-21 LAB — CBC WITH DIFFERENTIAL/PLATELET
Basophils Absolute: 0 10*3/uL (ref 0.0–0.1)
Basophils Relative: 0.7 % (ref 0.0–3.0)
Eosinophils Absolute: 0.4 10*3/uL (ref 0.0–0.7)
Eosinophils Relative: 6.2 % — ABNORMAL HIGH (ref 0.0–5.0)
HCT: 45.7 % (ref 39.0–52.0)
Hemoglobin: 15.3 g/dL (ref 13.0–17.0)
Lymphocytes Relative: 31.4 % (ref 12.0–46.0)
Lymphs Abs: 1.8 10*3/uL (ref 0.7–4.0)
MCHC: 33.5 g/dL (ref 30.0–36.0)
MCV: 92.7 fl (ref 78.0–100.0)
Monocytes Absolute: 0.8 10*3/uL (ref 0.1–1.0)
Monocytes Relative: 13.3 % — ABNORMAL HIGH (ref 3.0–12.0)
Neutro Abs: 2.8 10*3/uL (ref 1.4–7.7)
Neutrophils Relative %: 48.4 % (ref 43.0–77.0)
Platelets: 162 10*3/uL (ref 150.0–400.0)
RBC: 4.93 Mil/uL (ref 4.22–5.81)
RDW: 14.1 % (ref 11.5–15.5)
WBC: 5.8 10*3/uL (ref 4.0–10.5)

## 2019-06-21 LAB — BASIC METABOLIC PANEL
BUN: 20 mg/dL (ref 6–23)
CO2: 29 mEq/L (ref 19–32)
Calcium: 9.8 mg/dL (ref 8.4–10.5)
Chloride: 101 mEq/L (ref 96–112)
Creatinine, Ser: 0.81 mg/dL (ref 0.40–1.50)
GFR: 94.67 mL/min (ref >60.00)
Glucose, Bld: 136 mg/dL — ABNORMAL HIGH (ref 70–99)
Potassium: 4.9 mEq/L (ref 3.5–5.1)
Sodium: 138 mEq/L (ref 135–145)

## 2019-06-21 LAB — HEPATIC FUNCTION PANEL
ALT: 36 U/L (ref 0–53)
AST: 28 U/L (ref 0–37)
Albumin: 4.1 g/dL (ref 3.5–5.2)
Alkaline Phosphatase: 61 U/L (ref 39–117)
Bilirubin, Direct: 0.2 mg/dL (ref 0.0–0.3)
Total Bilirubin: 0.7 mg/dL (ref 0.2–1.2)
Total Protein: 7.6 g/dL (ref 6.0–8.3)

## 2019-06-21 LAB — TSH: TSH: 1.2 u[IU]/mL (ref 0.35–4.50)

## 2019-06-21 LAB — PSA: PSA: 1.59 ng/mL (ref 0.10–4.00)

## 2019-06-21 MED ORDER — SILDENAFIL CITRATE 20 MG PO TABS: ORAL_TABLET | ORAL | 3 refills | Status: DC

## 2019-06-21 NOTE — Progress Notes (Signed)
Subjective:     Patient ID: Stephen Woods, male   DOB: July 02, 1950, 69 y.o.   MRN: [id number]  HPI   [name] here for a complete physical.  He has history of hyperlipidemia, hypertension, erectile dysfunction.  He has gained some weight this year but blood pressure has been stable.  Not getting any regular exercise.  He did retire last spring.  Health maintenance reviewed.  He has never had colonoscopy but is willing to consider at this time.  No history of shingles vaccine.  Otherwise immunizations are up-to-date.  Past Medical History:  Diagnosis Date  . Arthritis   . Erectile dysfunction   . Hypertension    Past Surgical History:  Procedure Laterality Date  . NO PAST SURGERIES    . TOTAL HIP ARTHROPLASTY Right 05/10/2016   Procedure: RIGHT TOTAL HIP ARTHROPLASTY ANTERIOR APPROACH;  Surgeon: Paralee Cancel, MD;  Location: [facility];  Service: Orthopedics;  Laterality: Right;    reports that he has never smoked. He has quit using smokeless tobacco.  His smokeless tobacco use included snuff. He reports current alcohol use of about 7.0 standard drinks of alcohol per week. He reports that he does not use drugs. family history includes Arthritis in his mother; Diabetes in his brother; Heart disease (age of onset: 70) in his father; Heart disease (age of onset: 25) in his mother; Heart disease (age of onset: 56) in his brother; Hypertension in his father; Stroke in his father. Allergies  Allergen Reactions  . Ampicillin Hives    hives     Review of Systems  Constitutional: Negative for activity change, appetite change, fatigue and fever.  HENT: Negative for congestion, ear pain and trouble swallowing.   Eyes: Negative for pain and visual disturbance.  Respiratory: Negative for cough, chest tightness, shortness of breath and wheezing.   Cardiovascular: Negative for chest pain, palpitations and leg swelling.  Gastrointestinal: Negative for abdominal distention, abdominal pain, blood in  stool, constipation, diarrhea, nausea, rectal pain and vomiting.  Genitourinary: Negative for dysuria, hematuria and testicular pain.  Musculoskeletal: Negative for arthralgias and joint swelling.  Skin: Negative for rash.  Neurological: Negative for dizziness, syncope, weakness, light-headedness and headaches.  Hematological: Negative for adenopathy.  Psychiatric/Behavioral: Negative for confusion and dysphoric mood.       Objective:   Physical Exam Vitals reviewed.  Constitutional:      Appearance: Normal appearance.  HENT:     Right Ear: Tympanic membrane and ear canal normal.     Left Ear: Tympanic membrane and ear canal normal.     Mouth/Throat:     Mouth: Mucous membranes are moist.     Pharynx: Oropharynx is clear.  Eyes:     Pupils: Pupils are equal, round, and reactive to light.  Cardiovascular:     Rate and Rhythm: Normal rate and regular rhythm.     Heart sounds: No murmur.  Pulmonary:     Effort: Pulmonary effort is normal.     Breath sounds: Normal breath sounds. No rales.  Abdominal:     Palpations: Abdomen is soft. There is no mass.     Tenderness: There is no abdominal tenderness. There is no guarding or rebound.  Musculoskeletal:     Cervical back: Neck supple.     Right lower leg: No edema.     Left lower leg: No edema.  Lymphadenopathy:     Cervical: No cervical adenopathy.  Neurological:     General: No focal deficit present.  Mental Status: He is alert.     Cranial Nerves: No cranial nerve deficit.  Psychiatric:        Mood and Affect: Mood normal.        Thought Content: Thought content normal.        Assessment:     Physical exam. He has chronic problems including hyperlipidemia, hypertension, erectile dysfunction. Strong family history of coronary artery disease    Plan:     -EKG shows sinus rhythm with no acute changes -We discussed colon cancer screening. He is willing this year to go ahead and get colonoscopy scheduled and this  will be done  -We discussed Shingrix vaccine and he is encouraged to check with his insurance coverage  -Refill sildenafil for as needed use  -He is encouraged to try to lose some weight  -Obtain screening labs. Patient requesting PSA  -Covid vaccine already given  Eulas Post MD [facility]

## 2019-06-21 NOTE — Patient Instructions (Signed)

## 2019-06-26 ENCOUNTER — Other Ambulatory Visit: Payer: Self-pay

## 2019-06-26 ENCOUNTER — Ambulatory Visit (AMBULATORY_SURGERY_CENTER): Payer: Self-pay | Admitting: *Deleted

## 2019-06-26 VITALS — Temp 97.8°F | Ht 67.5 in | Wt 225.0 lb

## 2019-06-26 DIAGNOSIS — Z1211 Encounter for screening for malignant neoplasm of colon: Secondary | ICD-10-CM

## 2019-06-26 MED ORDER — PLENVU 140 G PO SOLR: 1.0000 | ORAL | 0 refills | Status: DC

## 2019-06-26 NOTE — Progress Notes (Signed)
Completed covid series 06-22-2019 per pt   No egg or soy allergy known to patient  No issues with past sedation with any surgeries  or procedures, no intubation problems  No diet pills per patient No home 02 use per patient  No blood thinners per patient  Pt denies issues with constipation  No A fib or A flutter  EMMI video sent to pt's e mail   Due to the COVID-19 pandemic we are asking patients to follow these guidelines. Please only bring one care partner. Please be aware that your care partner may wait in the car in the parking lot or if they feel like they will be too hot to wait in the car, they may wait in the lobby on the 4th floor. All care partners are required to wear a mask the entire time (we do not have any that we can provide them), they need to practice social distancing, and we will do a Covid check for all patient's and care partners when you arrive. Also we will check their temperature and your temperature. If the care partner waits in their car they need to stay in the parking lot the entire time and we will call them on their cell phone when the patient is ready for discharge so they can bring the car to the front of the building. Also all patient's will need to wear a mask into building.

## 2019-07-01 ENCOUNTER — Telehealth: Payer: Self-pay | Admitting: Family Medicine

## 2019-07-01 NOTE — Progress Notes (Signed)
Error. Patient already has a encounter note for 07/01/2019.    Stephen Woods [employer] Scheduler

## 2019-07-01 NOTE — Chronic Care Management (AMB) (Signed)
  Chronic Care Management   Note  07/01/2019 Name: TZVI POTT MRN: [id number] DOB: October 30, 1950  ANDEN BROUILLETTE is a 69 y.o. year old male who is a primary care patient of Burchette, Alinda Sierras, MD. I reached out to Eugenio Hoes by phone today in response to a referral sent by Mr. Daisey Must Carrithers's PCP, Eulas Post, MD.   Mr. Aagaard was given information about Chronic Care Management services today including:  1. CCM service includes personalized support from designated clinical staff supervised by his physician, including individualized plan of care and coordination with other care providers 2. 24/7 contact phone numbers for assistance for urgent and routine care needs. 3. Service will only be billed when office clinical staff spend 20 minutes or more in a month to coordinate care. 4. Only one practitioner may furnish and bill the service in a calendar month. 5. The patient may stop CCM services at any time (effective at the end of the month) by phone call to the office staff.   Patient agreed to services and verbal consent obtained.   Follow up plan:   Raynicia Dukes [employer] Scheduler

## 2019-07-08 ENCOUNTER — Encounter: Payer: Self-pay | Admitting: Gastroenterology

## 2019-07-12 ENCOUNTER — Other Ambulatory Visit: Payer: Self-pay

## 2019-07-12 ENCOUNTER — Encounter: Payer: Self-pay | Admitting: Gastroenterology

## 2019-07-12 ENCOUNTER — Ambulatory Visit (AMBULATORY_SURGERY_CENTER): Payer: PPO | Admitting: Gastroenterology

## 2019-07-12 VITALS — BP 137/85 | HR 101 | Temp 97.7°F | Resp 14 | Ht 67.5 in | Wt 225.0 lb

## 2019-07-12 DIAGNOSIS — Z1211 Encounter for screening for malignant neoplasm of colon: Secondary | ICD-10-CM | POA: Diagnosis not present

## 2019-07-12 DIAGNOSIS — K635 Polyp of colon: Secondary | ICD-10-CM

## 2019-07-12 DIAGNOSIS — E7800 Pure hypercholesterolemia, unspecified: Secondary | ICD-10-CM | POA: Diagnosis not present

## 2019-07-12 DIAGNOSIS — I10 Essential (primary) hypertension: Secondary | ICD-10-CM | POA: Diagnosis not present

## 2019-07-12 DIAGNOSIS — D125 Benign neoplasm of sigmoid colon: Secondary | ICD-10-CM | POA: Diagnosis not present

## 2019-07-12 DIAGNOSIS — D122 Benign neoplasm of ascending colon: Secondary | ICD-10-CM

## 2019-07-12 HISTORY — PX: COLONOSCOPY: SHX174

## 2019-07-12 MED ORDER — SODIUM CHLORIDE 0.9 % IV SOLN: 500.0000 mL | Freq: Once | INTRAVENOUS | Status: DC

## 2019-07-12 NOTE — Progress Notes (Signed)
Pt's states no medical or surgical changes since previsit or office visit.  Temp LC Vitals DT 

## 2019-07-12 NOTE — Progress Notes (Signed)
Called to room to assist during endoscopic procedure.  Patient ID and intended procedure confirmed with present staff. Received instructions for my participation in the procedure from the performing physician.  

## 2019-07-12 NOTE — Op Note (Signed)
[place] Patient Name: Stephen Woods Procedure Date: 07/12/2019 9:30 AM MRN: [id number] Endoscopist: Remo Lipps P. Havery Moros , MD Age: 69 Referring MD:  Date of Birth: [birth year] Gender: Male Account #: [id number] Procedure:                Colonoscopy Indications:              Screening for colorectal malignant neoplasm, This                            is the patient's first colonoscopy Medicines:                Monitored Anesthesia Care Procedure:                Pre-Anesthesia Assessment:                           - Prior to the procedure, a History and Physical                            was performed, and patient medications and                            allergies were reviewed. The patient's tolerance of                            previous anesthesia was also reviewed. The risks                            and benefits of the procedure and the sedation                            options and risks were discussed with the patient.                            All questions were answered, and informed consent                            was obtained. Prior Anticoagulants: The patient has                            taken no previous anticoagulant or antiplatelet                            agents. ASA Grade Assessment: II - A patient with                            mild systemic disease. After reviewing the risks                            and benefits, the patient was deemed in                            satisfactory condition to undergo the procedure.  After obtaining informed consent, the colonoscope                            was passed under direct vision. Throughout the                            procedure, the patient's blood pressure, pulse, and                            oxygen saturations were monitored continuously. The                            Colonoscope was introduced through the anus and                            advanced to the the  cecum, identified by                            appendiceal orifice and ileocecal valve. The                            colonoscopy was performed without difficulty. The                            patient tolerated the procedure well. The quality                            of the bowel preparation was good. The ileocecal                            valve, appendiceal orifice, and rectum were                            photographed. Scope In: 9:35:12 AM Scope Out: 9:51:22 AM Scope Withdrawal Time: 0 hours 14 minutes 6 seconds  Total Procedure Duration: 0 hours 16 minutes 10 seconds  Findings:                 The perianal and digital rectal examinations were                            normal.                           A 6 mm polyp was found in the ascending colon. The                            polyp was flat. The polyp was removed with a cold                            snare. Resection and retrieval were complete.                           Two pedunculated polyps were found in the sigmoid  colon. The polyps were 6 to 8 mm in size. These                            polyps were removed with a hot snare. Resection and                            retrieval were complete.                           Internal hemorrhoids were found during                            retroflexion. The hemorrhoids were small.                           The exam was otherwise without abnormality. Complications:            No immediate complications. Estimated blood loss:                            Minimal. Estimated Blood Loss:     Estimated blood loss was minimal. Impression:               - One 6 mm polyp in the ascending colon, removed                            with a cold snare. Resected and retrieved.                           - Two 6 to 8 mm polyps in the sigmoid colon,                            removed with a hot snare. Resected and retrieved.                           - Internal  hemorrhoids.                           - The examination was otherwise normal. Recommendation:           - Patient has a contact number available for                            emergencies. The signs and symptoms of potential                            delayed complications were discussed with the                            patient. Return to normal activities tomorrow.                            Written discharge instructions were provided to the  patient.                           - Resume previous diet.                           - Continue present medications.                           - Await pathology results. Remo Lipps P. Han Lysne, MD 07/12/2019 9:55:35 AM This report has been signed electronically.

## 2019-07-12 NOTE — Progress Notes (Signed)
Report given to PACU, vss 

## 2019-07-12 NOTE — Patient Instructions (Signed)
Handout provided on polyps.   YOU HAD AN ENDOSCOPIC PROCEDURE TODAY AT THE [facility]:   Refer to the procedure report that was given to you for any specific questions about what was found during the examination.  If the procedure report does not answer your questions, please call your gastroenterologist to clarify.  If you requested that your care partner not be given the details of your procedure findings, then the procedure report has been included in a sealed envelope for you to review at your convenience later.  YOU SHOULD EXPECT: Some feelings of bloating in the abdomen. Passage of more gas than usual.  Walking can help get rid of the air that was put into your GI tract during the procedure and reduce the bloating. If you had a lower endoscopy (such as a colonoscopy or flexible sigmoidoscopy) you may notice spotting of blood in your stool or on the toilet paper. If you underwent a bowel prep for your procedure, you may not have a normal bowel movement for a few days.  Please Note:  You might notice some irritation and congestion in your nose or some drainage.  This is from the oxygen used during your procedure.  There is no need for concern and it should clear up in a day or so.  SYMPTOMS TO REPORT IMMEDIATELY:  Following lower endoscopy (colonoscopy or flexible sigmoidoscopy):  Excessive amounts of blood in the stool  Significant tenderness or worsening of abdominal pains  Swelling of the abdomen that is new, acute  Fever of 100F or higher  For urgent or emergent issues, a gastroenterologist can be reached at any hour by calling [phone]. Do not use MyChart messaging for urgent concerns.    DIET:  We do recommend a small meal at first, but then you may proceed to your regular diet.  Drink plenty of fluids but you should avoid alcoholic beverages for 24 hours.  ACTIVITY:  You should plan to take it easy for the rest of today and you should NOT DRIVE or use heavy  machinery until tomorrow (because of the sedation medicines used during the test).    FOLLOW UP: Our staff will call the number listed on your records 48-72 hours following your procedure to check on you and address any questions or concerns that you may have regarding the information given to you following your procedure. If we do not reach you, we will leave a message.  We will attempt to reach you two times.  During this call, we will ask if you have developed any symptoms of COVID 19. If you develop any symptoms (ie: fever, flu-like symptoms, shortness of breath, cough etc.) before then, please call [phone].  If you test positive for Covid 19 in the 2 weeks post procedure, please call and report this information to us.    If any biopsies were taken you will be contacted by phone or by letter within the next 1-3 weeks.  Please call us at [phone] if you have not heard about the biopsies in 3 weeks.    SIGNATURES/CONFIDENTIALITY: You and/or your care partner have signed paperwork which will be entered into your electronic medical record.  These signatures attest to the fact that that the information above on your After Visit Summary has been reviewed and is understood.  Full responsibility of the confidentiality of this discharge information lies with you and/or your care-partner.  

## 2019-07-16 ENCOUNTER — Telehealth: Payer: Self-pay | Admitting: *Deleted

## 2019-07-16 ENCOUNTER — Telehealth: Payer: PPO

## 2019-07-16 NOTE — Telephone Encounter (Signed)
  Follow up Call-  Call back number 07/12/2019  Post procedure Call Back phone  # [phone]  Permission to leave phone message Yes  Some recent data might be hidden     Patient questions:  Do you have a fever, pain , or abdominal swelling? No. Pain Score  0 *  Have you tolerated food without any problems? Yes.    Have you been able to return to your normal activities? Yes.    Do you have any questions about your discharge instructions: Diet   No. Medications  No. Follow up visit  No.  Do you have questions or concerns about your Care? No.  Actions: * If pain score is 4 or above: No action needed, pain <4.  1. Have you developed a fever since your procedure? NO  2.   Have you had an respiratory symptoms (SOB or cough) since your procedure? NO  3.   Have you tested positive for COVID 19 since your procedure NO  4.   Have you had any family members/close contacts diagnosed with the COVID 19 since your procedure?  NO   If yes to any of these questions please route to Joylene John, RN and Erenest Rasher, RN

## 2019-07-16 NOTE — Telephone Encounter (Signed)
First attempt, left VM.  

## 2019-09-23 ENCOUNTER — Ambulatory Visit: Payer: PPO | Admitting: Family Medicine

## 2019-11-03 ENCOUNTER — Other Ambulatory Visit: Payer: Self-pay | Admitting: Family Medicine

## 2020-02-13 ENCOUNTER — Other Ambulatory Visit: Payer: Self-pay | Admitting: Family Medicine

## 2020-03-06 ENCOUNTER — Telehealth: Payer: Self-pay | Admitting: Family Medicine

## 2020-03-06 NOTE — Telephone Encounter (Signed)
Spoke with pt, we went over the information below, he verbalized understanding.

## 2020-03-06 NOTE — Telephone Encounter (Signed)
Agree.  He is finished with pneumonia vaccines.

## 2020-03-06 NOTE — Telephone Encounter (Signed)
Has had both pneumonia vaccines, last one was 2019.

## 2020-03-06 NOTE — Telephone Encounter (Signed)
Pt is calling to see if he is eligible for a pneumonia vaccine or is he all caught up on it.

## 2022-05-31 ENCOUNTER — Encounter: Payer: Self-pay | Admitting: Family Medicine

## 2022-05-31 ENCOUNTER — Ambulatory Visit (INDEPENDENT_AMBULATORY_CARE_PROVIDER_SITE_OTHER): Payer: PPO | Admitting: Family Medicine

## 2022-05-31 VITALS — BP 130/76 | HR 70 | Temp 98.0°F | Ht 66.93 in | Wt 193.3 lb

## 2022-05-31 DIAGNOSIS — E1165 Type 2 diabetes mellitus with hyperglycemia: Secondary | ICD-10-CM | POA: Diagnosis not present

## 2022-05-31 DIAGNOSIS — N400 Enlarged prostate without lower urinary tract symptoms: Secondary | ICD-10-CM

## 2022-05-31 DIAGNOSIS — Z125 Encounter for screening for malignant neoplasm of prostate: Secondary | ICD-10-CM | POA: Diagnosis not present

## 2022-05-31 DIAGNOSIS — Z0000 Encounter for general adult medical examination without abnormal findings: Secondary | ICD-10-CM | POA: Diagnosis not present

## 2022-05-31 LAB — CBC WITH DIFFERENTIAL/PLATELET
Basophils Absolute: 0 10*3/uL (ref 0.0–0.1)
Basophils Relative: 0.7 % (ref 0.0–3.0)
Eosinophils Absolute: 0.2 10*3/uL (ref 0.0–0.7)
Eosinophils Relative: 3.6 % (ref 0.0–5.0)
HCT: 43.4 % (ref 39.0–52.0)
Hemoglobin: 14.5 g/dL (ref 13.0–17.0)
Lymphocytes Relative: 30.7 % (ref 12.0–46.0)
Lymphs Abs: 1.4 10*3/uL (ref 0.7–4.0)
MCHC: 33.4 g/dL (ref 30.0–36.0)
MCV: 90.5 fl (ref 78.0–100.0)
Monocytes Absolute: 0.5 10*3/uL (ref 0.1–1.0)
Monocytes Relative: 11.3 % (ref 3.0–12.0)
Neutro Abs: 2.4 10*3/uL (ref 1.4–7.7)
Neutrophils Relative %: 53.7 % (ref 43.0–77.0)
Platelets: 198 10*3/uL (ref 150.0–400.0)
RBC: 4.79 Mil/uL (ref 4.22–5.81)
RDW: 14.3 % (ref 11.5–15.5)
WBC: 4.6 10*3/uL (ref 4.0–10.5)

## 2022-05-31 LAB — BASIC METABOLIC PANEL
BUN: 18 mg/dL (ref 6–23)
CO2: 30 mEq/L (ref 19–32)
Calcium: 9.8 mg/dL (ref 8.4–10.5)
Chloride: 101 mEq/L (ref 96–112)
Creatinine, Ser: 0.78 mg/dL (ref 0.40–1.50)
GFR: 89.86 mL/min (ref >60.00)
Glucose, Bld: 108 mg/dL — ABNORMAL HIGH (ref 70–99)
Potassium: 5.3 mEq/L — ABNORMAL HIGH (ref 3.5–5.1)
Sodium: 139 mEq/L (ref 135–145)

## 2022-05-31 LAB — LIPID PANEL
Cholesterol: 129 mg/dL (ref 0–200)
HDL: 52.8 mg/dL (ref >39.00)
LDL Cholesterol: 61 mg/dL (ref 0–99)
NonHDL: 75.91
Total CHOL/HDL Ratio: 2
Triglycerides: 76 mg/dL (ref 0.0–149.0)
VLDL: 15.2 mg/dL (ref 0.0–40.0)

## 2022-05-31 LAB — HEPATIC FUNCTION PANEL
ALT: 20 U/L (ref 0–53)
AST: 19 U/L (ref 0–37)
Albumin: 4.4 g/dL (ref 3.5–5.2)
Alkaline Phosphatase: 54 U/L (ref 39–117)
Bilirubin, Direct: 0.1 mg/dL (ref 0.0–0.3)
Total Bilirubin: 0.6 mg/dL (ref 0.2–1.2)
Total Protein: 7.5 g/dL (ref 6.0–8.3)

## 2022-05-31 LAB — HEMOGLOBIN A1C: Hgb A1c MFr Bld: 6.3 % (ref 4.6–6.5)

## 2022-05-31 LAB — MICROALBUMIN / CREATININE URINE RATIO
Creatinine,U: 77.2 mg/dL
Microalb Creat Ratio: 2.2 mg/g (ref 0.0–30.0)
Microalb, Ur: 1.7 mg/dL (ref 0.0–1.9)

## 2022-05-31 LAB — PSA, MEDICARE: PSA: 1.01 ng/ml (ref 0.10–4.00)

## 2022-05-31 LAB — TSH: TSH: 1.22 u[IU]/mL (ref 0.35–5.50)

## 2022-05-31 MED ORDER — METFORMIN HCL 500 MG PO TABS: 1000.0000 mg | ORAL_TABLET | Freq: Two times a day (BID) | ORAL | 3 refills | Status: DC

## 2022-05-31 MED ORDER — SILDENAFIL CITRATE 20 MG PO TABS: ORAL_TABLET | ORAL | 3 refills | Status: AC

## 2022-05-31 MED ORDER — LISINOPRIL 10 MG PO TABS: 10.0000 mg | ORAL_TABLET | Freq: Every day | ORAL | 3 refills | Status: DC

## 2022-05-31 MED ORDER — ATORVASTATIN CALCIUM 20 MG PO TABS: 20.0000 mg | ORAL_TABLET | Freq: Every day | ORAL | 3 refills | Status: DC

## 2022-05-31 MED ORDER — FINASTERIDE 5 MG PO TABS: 5.0000 mg | ORAL_TABLET | Freq: Every day | ORAL | 3 refills | Status: DC

## 2022-05-31 NOTE — Progress Notes (Signed)
Established Patient Office Visit  Subjective   Patient ID: BAROK BONNIE, male    DOB: [birth year]  Age: 72 y.o. MRN: [id number]  Chief Complaint  Patient presents with   Annual Exam    HPI   Mr. Mill is seen for physical exam-and for additional items as below.  He is basically reestablishing care here after being away for couple years..  He had moved to [place] about 2 years ago to be near his son.  He does move back to [place] area recently.  He was diagnosed with type 2 diabetes couple years ago.  He states his last A1c was low 6 range.  He is on metformin now.  He had some sciatic issues and takes Celebrex and also gabapentin.  He has BPH and is on finasteride 5 mg daily.  No obstructive urinary symptoms.  Health maintenance reviewed  -Flu shot already given -Pneumonia shots complete -No history of Shingrix -Colonoscopy up-to-date -Tetanus up-to-date  Social history-he is married.  He has 1 son who still is up in [place].  No grandchildren.  No history of smoking.  Drinks 1/2 glass of wine which is really just 2 to 3 ounces once daily.  Family history strong for coronary disease in both parents.  He had a brother with coronary disease as well as well as type 2 diabetes.  Past Medical History:  Diagnosis Date   Arthritis    Cancer ([facility])    skin cancer forehead    Erectile dysfunction    Hyperlipidemia    Hypertension    controlled on meds    Past Surgical History:  Procedure Laterality Date   COLONOSCOPY  07/12/2019   TOTAL HIP ARTHROPLASTY Right 05/10/2016   Procedure: RIGHT TOTAL HIP ARTHROPLASTY ANTERIOR APPROACH;  Surgeon: Paralee Cancel, MD;  Location: [facility];  Service: Orthopedics;  Laterality: Right;    reports that he has never smoked. He has quit using smokeless tobacco.  His smokeless tobacco use included snuff. He reports current alcohol use of about 7.0 standard drinks of alcohol per week. He reports that he does not use drugs. family  history includes Arthritis in his mother; Diabetes in his brother; Heart disease (age of onset: 43) in his father; Heart disease (age of onset: 58) in his mother; Heart disease (age of onset: 32) in his brother; Hypertension in his father; Stroke in his father. Allergies  Allergen Reactions   Ampicillin Hives    hives    Review of Systems  Constitutional:  Negative for chills, fever, malaise/fatigue and weight loss.  HENT:  Negative for hearing loss.   Eyes:  Negative for blurred vision and double vision.  Respiratory:  Negative for cough and shortness of breath.   Cardiovascular:  Negative for chest pain, palpitations and leg swelling.  Gastrointestinal:  Negative for abdominal pain, blood in stool, constipation and diarrhea.  Genitourinary:  Negative for dysuria.  Musculoskeletal:  Positive for back pain.  Skin:  Negative for rash.  Neurological:  Negative for dizziness, speech change, seizures, loss of consciousness, weakness and headaches.  Psychiatric/Behavioral:  Negative for depression.       Objective:     BP 130/76 (BP Location: Left Arm, Patient Position: Sitting, Cuff Size: Normal)   Pulse 70   Temp 98 F (36.7 C) (Oral)   Ht 5' 6.93" (1.7 m)   Wt 193 lb 4.8 oz (87.7 kg)   SpO2 98%   BMI 30.34 kg/m  BP Readings from Last 3 Encounters:  05/31/22 130/76  07/12/19 137/85  06/21/19 126/78   Wt Readings from Last 3 Encounters:  05/31/22 193 lb 4.8 oz (87.7 kg)  07/12/19 225 lb (102.1 kg)  06/26/19 225 lb (102.1 kg)      Physical Exam Vitals reviewed.  Constitutional:      General: He is not in acute distress.    Appearance: He is well-developed.  HENT:     Head: Normocephalic and atraumatic.     Right Ear: External ear normal.     Left Ear: External ear normal.  Eyes:     Conjunctiva/sclera: Conjunctivae normal.     Pupils: Pupils are equal, round, and reactive to light.  Neck:     Thyroid: No thyromegaly.  Cardiovascular:     Rate and Rhythm: Normal  rate and regular rhythm.     Heart sounds: Normal heart sounds. No murmur heard. Pulmonary:     Effort: No respiratory distress.     Breath sounds: No wheezing or rales.  Abdominal:     General: Bowel sounds are normal. There is no distension.     Palpations: Abdomen is soft. There is no mass.     Tenderness: There is no abdominal tenderness. There is no guarding or rebound.  Musculoskeletal:     Cervical back: Normal range of motion and neck supple.     Right lower leg: No edema.     Left lower leg: No edema.  Lymphadenopathy:     Cervical: No cervical adenopathy.  Skin:    Findings: No rash.     Comments: Feet reveal no skin lesions. Good distal foot pulses. Good capillary refill. No calluses. Normal sensation with monofilament testing   Neurological:     Mental Status: He is alert and oriented to person, place, and time.     Cranial Nerves: No cranial nerve deficit.      No results found for any visits on 05/31/22.  Last CBC Lab Results  Component Value Date   WBC 4.6 05/31/2022   HGB 14.5 05/31/2022   HCT 43.4 05/31/2022   MCV 90.5 05/31/2022   MCH 31.2 07/14/2017   RDW 14.3 05/31/2022   PLT 198.0 [id number]   Last metabolic panel Lab Results  Component Value Date   GLUCOSE 108 (H) 05/31/2022   NA 139 05/31/2022   K 5.3 No hemolysis seen (H) 05/31/2022   CL 101 05/31/2022   CO2 30 05/31/2022   BUN 18 05/31/2022   CREATININE 0.78 05/31/2022   GFRNONAA >60 05/11/2016   CALCIUM 9.8 05/31/2022   PROT 7.5 05/31/2022   ALBUMIN 4.4 05/31/2022   BILITOT 0.6 05/31/2022   ALKPHOS 54 05/31/2022   AST 19 05/31/2022   ALT 20 05/31/2022   ANIONGAP 7 05/11/2016   Last lipids Lab Results  Component Value Date   CHOL 129 05/31/2022   HDL 52.80 05/31/2022   LDLCALC 61 05/31/2022   LDLDIRECT 166.6 03/05/2012   TRIG 76.0 05/31/2022   CHOLHDL 2 05/31/2022   Last hemoglobin A1c Lab Results  Component Value Date   HGBA1C 6.3 05/31/2022   Last thyroid  functions Lab Results  Component Value Date   TSH 1.22 05/31/2022      The ASCVD Risk score (Arnett DK, et al., 2019) failed to calculate for the following reasons:   The valid total cholesterol range is 130 to 320 mg/dL    Assessment & Plan:   #1 physical exam.  We discussed the following health maintenance issues  -Vaccines up-to-date with  exception of Shingrix.  Also would consider RSV given his age and other risk factors -The natural history of prostate cancer and ongoing controversy regarding screening and potential treatment outcomes of prostate cancer has been discussed with the patient. The meaning of a false positive PSA and a false negative PSA has been discussed. He indicates understanding of the limitations of this screening test and wishes to proceed with screening PSA testing.  #2 type 2 diabetes.  Was diagnosed apparently about 2 years ago.  Now on metformin.  We added this to problem list.  Reminded to get yearly eye exam.  Refill metformin for 1 year.  Check A1c.  Check urine microalbumin screen.  Will determine interval for follow-up based on lab results  #3 BPH which is added is a new problem.  This was not on his problem list previously.  Currently controlled on finasteride 5 mg daily.  #4 hypertension stable.  Refill lisinopril for 1 year  #5 hyperlipidemia treated with atorvastatin.  Recheck lipid and hepatic panel.   Return in about 1 year (around 06/01/2023).    Carolann Littler, MD

## 2022-05-31 NOTE — Patient Instructions (Signed)
Consider Shingrix vaccine and may want to check on insurance coverage first.

## 2022-06-01 MED ORDER — CELECOXIB 200 MG PO CAPS: ORAL_CAPSULE | ORAL | 3 refills | Status: DC

## 2022-06-01 NOTE — Addendum Note (Signed)
Addended by: Nilda Riggs on: 06/01/2022 01:29 PM   Modules accepted: Orders

## 2022-06-30 ENCOUNTER — Encounter: Payer: Self-pay | Admitting: Family Medicine

## 2022-07-01 MED ORDER — GABAPENTIN 300 MG PO CAPS: ORAL_CAPSULE | ORAL | 0 refills | Status: DC

## 2022-07-12 ENCOUNTER — Telehealth: Payer: Self-pay | Admitting: Family Medicine

## 2022-07-12 NOTE — Telephone Encounter (Signed)
Contacted Eugenio Hoes to schedule their annual wellness visit. Appointment made for 07/13/22.  Barkley Boards AW[facility] direct phone # [phone]

## 2022-08-09 ENCOUNTER — Telehealth: Payer: Self-pay | Admitting: Family Medicine

## 2022-08-09 NOTE — Telephone Encounter (Signed)
Contacted Stephen Woods to schedule their annual wellness visit. Call back at later date: 08/2022  Stephen Woods AW[facility] direct phone # [phone]   Spoke with patient to schedule  he wanted call back mid may 2024

## 2022-08-19 ENCOUNTER — Encounter: Payer: Self-pay | Admitting: Family Medicine

## 2022-08-19 ENCOUNTER — Ambulatory Visit (INDEPENDENT_AMBULATORY_CARE_PROVIDER_SITE_OTHER): Payer: PPO | Admitting: Family Medicine

## 2022-08-19 VITALS — BP 146/90 | HR 78 | Temp 98.4°F | Ht 66.93 in | Wt 203.8 lb

## 2022-08-19 DIAGNOSIS — I10 Essential (primary) hypertension: Secondary | ICD-10-CM

## 2022-08-19 DIAGNOSIS — L570 Actinic keratosis: Secondary | ICD-10-CM

## 2022-08-19 NOTE — Progress Notes (Signed)
   Established Patient Office Visit   Subjective  Patient ID: Stephen Woods, male    DOB: 08-22-1950  Age: 72 y.o. MRN: [id number]  Chief Complaint  Patient presents with   Fall         Patient is a 72 year old male followed by Dr. Caryl Never and seen for ongoing concern.  Patient endorses receiving staples to his head several months ago after a fall.  Pt concerned that his staple may have been left in place as there is an area in the top of his head that is sore to touch.  Area is without erythema, drainage.  HTN: Patient is yet to take his BP meds this morning.  Plans to take them when he returns home.  Fall      ROS Negative unless stated above    Objective:     BP (!) 146/90 (BP Location: Left Arm, Patient Position: Sitting, Cuff Size: Normal)   Pulse 78   Temp 98.4 F (36.9 C) (Oral)   Ht 5' 6.93" (1.7 m)   Wt 203 lb 12.8 oz (92.4 kg)   SpO2 97%   BMI 31.99 kg/m    Physical Exam Constitutional:      General: He is not in acute distress.    Appearance: Normal appearance.  HENT:     Head: Normocephalic and atraumatic.     Nose: Nose normal.     Mouth/Throat:     Mouth: Mucous membranes are moist.  Cardiovascular:     Rate and Rhythm: Normal rate and regular rhythm.     Heart sounds: Normal heart sounds. No murmur heard.    No gallop.  Pulmonary:     Effort: Pulmonary effort is normal. No respiratory distress.     Breath sounds: Normal breath sounds. No wheezing, rhonchi or rales.  Skin:    General: Skin is warm and dry.          Comments: Midline parietal area at sagittal suture with a 3-4 mm dried whitish crusty raised area of skin.  No foreign material palpated.  Neurological:     Mental Status: He is alert and oriented to person, place, and time.      No results found for any visits on 08/19/22.    Assessment & Plan:  Actinic keratosis  Essential hypertension  Actinic keratoses at area of concern.  Patient advised to follow-up with his  dermatologist for removal.  Discussed using protective clothing and wearing sunscreen when outdoors.  Patient advised to take blood pressure medicine consistently.  Monitor BP at home.  For elevation will need medications adjusted.  Return if symptoms worsen or fail to improve.   Deeann Saint, MD

## 2022-08-24 ENCOUNTER — Encounter: Payer: Self-pay | Admitting: Family Medicine

## 2022-08-29 ENCOUNTER — Encounter: Payer: Self-pay | Admitting: Family Medicine

## 2022-08-29 ENCOUNTER — Ambulatory Visit (INDEPENDENT_AMBULATORY_CARE_PROVIDER_SITE_OTHER): Payer: PPO | Admitting: Family Medicine

## 2022-08-29 VITALS — BP 126/60 | HR 82 | Temp 98.2°F | Wt 202.4 lb

## 2022-08-29 DIAGNOSIS — M159 Polyosteoarthritis, unspecified: Secondary | ICD-10-CM | POA: Diagnosis not present

## 2022-08-29 DIAGNOSIS — Z029 Encounter for administrative examinations, unspecified: Secondary | ICD-10-CM | POA: Diagnosis not present

## 2022-08-29 DIAGNOSIS — G8929 Other chronic pain: Secondary | ICD-10-CM | POA: Diagnosis not present

## 2022-08-29 DIAGNOSIS — M25511 Pain in right shoulder: Secondary | ICD-10-CM

## 2022-08-29 NOTE — Progress Notes (Signed)
   Established Patient Office Visit   Subjective  Patient ID: Stephen Woods, male    DOB: 1950-05-19  Age: 72 y.o. MRN: [id number]  Chief Complaint  Patient presents with   forms    Needs forms completed for work    Patient is a 72 year old male followed by Dr. Caryl Never and seen for ongoing concern/administrative encounter.  Patient endorses history of arthritis in bilateral shoulders.  Seen by Ortho, Dr. Durene Romans, several years ago advised would eventually need shoulder surgery.  Patient notes bilateral shoulder pain right greater than left.  Patient taking Celebrex daily which is helping.  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  Patient notes history of dislocating right arm in early 20s.  Needs form completed for work.      ROS Negative unless stated above    Objective:     BP 126/60 (BP Location: Left Arm, Patient Position: Sitting, Cuff Size: Normal)   Pulse 82   Temp 98.2 F (36.8 C) (Oral)   Wt 202 lb 6.4 oz (91.8 kg)   SpO2 92%   BMI 31.77 kg/m    Physical Exam Constitutional:      Appearance: Normal appearance.  HENT:     Head: Normocephalic and atraumatic.     Nose: Nose normal.     Mouth/Throat:     Mouth: Mucous membranes are moist.  Eyes:     Extraocular Movements: Extraocular movements intact.     Conjunctiva/sclera: Conjunctivae normal.  Cardiovascular:     Rate and Rhythm: Normal rate and regular rhythm.  Pulmonary:     Effort: Pulmonary effort is normal.     Breath sounds: Normal breath sounds.  Musculoskeletal:     Comments: TTP of right AC joint and scapula.  Right UE with ROM limited to 40-45 degrees of abduction.  Unable to across body.  Unable to perform Hawkins or Neer's on RUE.  Neurological:     Mental Status: He is alert.      No results found for any visits on 08/29/22.    Assessment & Plan:   Chronic right shoulder pain  Primary osteoarthritis involving multiple joints  Administrative encounter  Patient with worsening OA of bilateral shoulders right greater than left.  Unable to perform overhead movements due to decreased ROM.  Advised to set up follow-up with Ortho given worsening symptoms.  Consider obtaining updated imaging.  Continue Celebrex and other supportive care.  Form completed and to be faxed to patient's employer.  Given strict precautions.  Return if symptoms worsen or fail to improve.    On day of service, 32 minutes spent caring for this patient face-to-face, reviewing the chart, counseling and/or coordinating care for plan and treatment of diagnosis below.     Deeann Saint, MD

## 2022-08-30 ENCOUNTER — Encounter: Payer: Self-pay | Admitting: Family Medicine

## 2022-09-01 ENCOUNTER — Ambulatory Visit: Payer: PPO | Admitting: Family Medicine

## 2022-09-15 DIAGNOSIS — H25813 Combined forms of age-related cataract, bilateral: Secondary | ICD-10-CM | POA: Diagnosis not present

## 2022-09-15 DIAGNOSIS — E119 Type 2 diabetes mellitus without complications: Secondary | ICD-10-CM | POA: Diagnosis not present

## 2022-09-15 LAB — HM DIABETES EYE EXAM

## 2022-09-29 ENCOUNTER — Other Ambulatory Visit: Payer: Self-pay | Admitting: Family Medicine

## 2022-10-28 ENCOUNTER — Other Ambulatory Visit: Payer: Self-pay | Admitting: Family Medicine

## 2023-01-25 ENCOUNTER — Other Ambulatory Visit: Payer: Self-pay | Admitting: Family Medicine

## 2023-03-01 DIAGNOSIS — H25811 Combined forms of age-related cataract, right eye: Secondary | ICD-10-CM | POA: Diagnosis not present

## 2023-03-01 DIAGNOSIS — E119 Type 2 diabetes mellitus without complications: Secondary | ICD-10-CM | POA: Diagnosis not present

## 2023-03-01 DIAGNOSIS — H52222 Regular astigmatism, left eye: Secondary | ICD-10-CM | POA: Diagnosis not present

## 2023-03-01 DIAGNOSIS — H25812 Combined forms of age-related cataract, left eye: Secondary | ICD-10-CM | POA: Diagnosis not present

## 2023-03-08 ENCOUNTER — Ambulatory Visit: Payer: PPO

## 2023-03-08 VITALS — Ht 70.0 in | Wt 193.0 lb

## 2023-03-08 DIAGNOSIS — Z0000 Encounter for general adult medical examination without abnormal findings: Secondary | ICD-10-CM | POA: Diagnosis not present

## 2023-03-08 NOTE — Progress Notes (Signed)
Subjective:   Stephen Woods is a 72 y.o. male who presents for Medicare Annual/Subsequent preventive examination.  Visit Complete: Virtual I connected with  Stephen Woods on 03/08/23 by a audio enabled telemedicine application and verified that I am speaking with the correct person using two identifiers.  Patient Location: Home  Provider Location: Home Office  I discussed the limitations of evaluation and management by telemedicine. The patient expressed understanding and agreed to proceed.  Vital Signs: Because this visit was a virtual/telehealth visit, some criteria may be missing or patient reported. Any vitals not documented were not able to be obtained and vitals that have been documented are patient reported.    Cardiac Risk Factors include: advanced age (>42men, >36 women);male gender;diabetes mellitus;hypertension     Objective:    Today's Vitals   03/08/23 1416  Weight: 193 lb (87.5 kg)  Height: 5\' 10"  (1.778 m)   Body mass index is 27.69 kg/m.     03/08/2023    2:23 PM 03/26/2019    2:45 PM 05/10/2016    2:45 PM 05/10/2016    8:53 AM 05/05/2016    1:27 PM  Advanced Directives  Does Patient Have a Medical Advance Directive? Yes Yes No No No  Type of Estate agent of [place];Living will Healthcare Power of [place];Living will     Does patient want to make changes to medical advance directive?  No - Patient declined     Copy of Healthcare Power of Attorney in Chart? No - copy requested No - copy requested     Would patient like information on creating a medical advance directive?   No - Patient declined No - Patient declined Yes (MAU/Ambulatory/Procedural Areas - Information given)    Current Medications (verified) Outpatient Encounter Medications as of 03/08/2023  Medication Sig   aspirin 81 MG chewable tablet Chew 1 tablet (81 mg total) by mouth 2 (two) times daily. Take for 4 weeks.   atorvastatin (LIPITOR) 20 MG tablet Take 1 tablet  (20 mg total) by mouth daily.   Calcium Carbonate-Vitamin D3 (CALCIUM 600-D) 600-400 MG-UNIT TABS Take 1 tablet by mouth daily.   celecoxib (CELEBREX) 200 MG capsule TAKE 1 CAPSULE BY MOUTH DAILY AS NEEDED   Cholecalciferol (VITAMIN D-3) 5000 units TABS Take 1 tablet by mouth daily.   finasteride (PROSCAR) 5 MG tablet Take 1 tablet (5 mg total) by mouth daily.   gabapentin (NEURONTIN) 300 MG capsule TAKE 2 CAPSULES BY MOUTH TWICE A DAY   Ginkgo Biloba 40 MG TABS Take 2 tablets by mouth daily.   lisinopril (ZESTRIL) 10 MG tablet Take 1 tablet (10 mg total) by mouth daily.   magnesium oxide (MAG-OX) 400 MG tablet Take 400 mg by mouth daily.   metFORMIN (GLUCOPHAGE) 500 MG tablet Take 2 tablets (1,000 mg total) by mouth 2 (two) times daily with a meal. Take two tablets by mouth twice daily.   Misc Natural Products (GLUCOSAMINE CHOND COMPLEX/MSM PO) Take 2 tablets by mouth daily.   Multiple Vitamins-Minerals (MULTIVITAMIN WITH MINERALS) tablet Take 1 tablet by mouth daily.   Omega-3 Fatty Acids (FISH OIL) 1000 MG CAPS Take 1 capsule by mouth daily.   sildenafil (REVATIO) 20 MG tablet Take 2 to 5 tablets one hour prior to sexual activity   zinc gluconate 50 MG tablet Take 50 mg by mouth daily.   No facility-administered encounter medications on file as of 03/08/2023.    Allergies (verified) Ampicillin   History: Past Medical History:  Diagnosis Date   Arthritis    Cancer (HCC)    skin cancer forehead    Erectile dysfunction    Hyperlipidemia    Hypertension    controlled on meds    Past Surgical History:  Procedure Laterality Date   COLONOSCOPY  07/12/2019   TOTAL HIP ARTHROPLASTY Right 05/10/2016   Procedure: RIGHT TOTAL HIP ARTHROPLASTY ANTERIOR APPROACH;  Surgeon: Durene Romans, MD;  Location: [facility];  Service: Orthopedics;  Laterality: Right;   Family History  Problem Relation Age of Onset   Arthritis Mother    Heart disease Mother 89   Heart disease Father 51   Hypertension  Father    Stroke Father    Diabetes Brother        type 2   Heart disease Brother 13       CAD   Colon cancer Neg Hx    Colon polyps Neg Hx    Esophageal cancer Neg Hx    Rectal cancer Neg Hx    Stomach cancer Neg Hx    Social History   Socioeconomic History   Marital status: Married    Spouse name: Not on file   Number of children: 1   Years of education: 12   Highest education level: Some college, no degree  Occupational History   Occupation: retired  Tobacco Use   Smoking status: Never   Smokeless tobacco: Former    Types: Snuff  Vaping Use   Vaping status: Never Used  Substance and Sexual Activity   Alcohol use: Yes    Alcohol/week: 7.0 standard drinks of alcohol    Types: 7 Glasses of wine per week    Comment: 1 glass with dinner   Drug use: No   Sexual activity: Not on file  Other Topics Concern   Not on file  Social History Narrative   Retired and married   1 dog and 1 cat   Social Determinants of Health   Financial Resource Strain: Low Risk  (03/08/2023)   Overall Financial Resource Strain (CARDIA)    Difficulty of Paying Living Expenses: Not hard at all  Food Insecurity: No Food Insecurity (03/08/2023)   Hunger Vital Sign    Worried About Running Out of Food in the Last Year: Never true    Ran Out of Food in the Last Year: Never true  Transportation Needs: No Transportation Needs (03/08/2023)   PRAPARE - Administrator, Civil Service (Medical): No    Lack of Transportation (Non-Medical): No  Physical Activity: Sufficiently Active (03/08/2023)   Exercise Vital Sign    Days of Exercise per Week: 7 days    Minutes of Exercise per Session: 30 min  Stress: No Stress Concern Present (03/08/2023)   [employer] of Occupational Health - Occupational Stress Questionnaire    Feeling of Stress : Not at all  Social Connections: Moderately Isolated (03/08/2023)   Social Connection and Isolation Panel [NHANES]    Frequency of Communication  with Friends and Family: More than three times a week    Frequency of Social Gatherings with Friends and Family: More than three times a week    Attends Religious Services: Never    Database administrator or Organizations: No    Attends Banker Meetings: Never    Marital Status: Married    Tobacco Counseling Counseling given: Not Answered   Clinical Intake:  Pre-visit preparation completed: Yes  Pain : No/denies pain     BMI -  recorded: 27.69 Nutritional Status: BMI 25 -29 Overweight Nutritional Risks: None Diabetes: Yes CBG done?: No Did pt. bring in CBG monitor from home?: No  How often do you need to have someone help you when you read instructions, pamphlets, or other written materials from your doctor or pharmacy?: 1 - Never  Interpreter Needed?: No  Information entered by :: Theresa Mulligan LPN   Activities of Daily Living    03/08/2023    2:22 PM  In your present state of health, do you have any difficulty performing the following activities:  Hearing? 0  Vision? 0  Difficulty concentrating or making decisions? 0  Walking or climbing stairs? 0  Dressing or bathing? 0  Doing errands, shopping? 0  Preparing Food and eating ? N  Using the Toilet? N  In the past six months, have you accidently leaked urine? N  Do you have problems with loss of bowel control? N  Managing your Medications? N  Managing your Finances? N  Housekeeping or managing your Housekeeping? N    Patient Care Team: Kristian Covey, MD as PCP - General (Family Medicine) Lutricia Feil, [facility] (Pharmacist)  Indicate any recent Medical Services you may have received from other than [facility] providers in the past year (date may be approximate).     Assessment:   This is a routine wellness examination for Stephen Woods.  Hearing/Vision screen Hearing Screening - Comments:: Denies hearing difficulties   Vision Screening - Comments:: Wears rx glasses - up to date with routine eye  exams with  [facility]   Goals Addressed               This Visit's Progress     Stay Active (pt-stated)         Depression Screen    03/08/2023    2:21 PM 08/19/2022    9:27 AM 05/31/2022    8:04 AM 06/21/2019    9:09 AM 03/26/2019    2:46 PM 07/14/2017    2:51 PM 07/05/2016   10:09 AM  PHQ 2/9 Scores  PHQ - 2 Score 0 0 0 0 0 0 0  PHQ- 9 Score    0       Fall Risk    03/08/2023    2:22 PM 08/19/2022    9:27 AM 05/31/2022    8:04 AM 06/21/2019    9:10 AM 03/26/2019    2:46 PM  Fall Risk   Falls in the past year? 0 1 0 0 0  Number falls in past yr: 0 0 0    Injury with Fall? 0 1 0    Risk for fall due to : No Fall Risks No Fall Risks No Fall Risks  Medication side effect  Follow up Falls prevention discussed Falls evaluation completed Falls evaluation completed  Falls evaluation completed;Education provided;Falls prevention discussed    MEDICARE RISK AT HOME: Medicare Risk at Home Any stairs in or around the home?: No If so, are there any without handrails?: No Home free of loose throw rugs in walkways, pet beds, electrical cords, etc?: Yes Adequate lighting in your home to reduce risk of falls?: Yes Life alert?: No Use of a cane, walker or w/c?: No Grab bars in the bathroom?: Yes Shower chair or bench in shower?: Yes Elevated toilet seat or a handicapped toilet?: No  TIMED UP AND GO:  Was the test performed?  No    Cognitive Function:        03/08/2023  2:23 PM  6CIT Screen  What Year? 0 points  What month? 0 points  What time? 0 points  Count back from 20 0 points  Months in reverse 0 points  Repeat phrase 0 points  Total Score 0 points    Immunizations Immunization History  Administered Date(s) Administered   Influenza, High Dose Seasonal PF 02/04/2019   Influenza-Unspecified 02/17/2023   Pfizer Covid-19 Vaccine Bivalent Booster 36yrs & up 01/30/2021   Pneumococcal Conjugate-13 07/05/2016   Pneumococcal Polysaccharide-23 07/14/2017   Td  07/14/2017   Tdap 04/19/2007, 07/14/2017    TDAP status: Up to date  Flu Vaccine status: Up to date  Pneumococcal vaccine status: Up to date  Covid-19 vaccine status: Declined, Education has been provided regarding the importance of this vaccine but patient still declined. Advised may receive this vaccine at local pharmacy or Health Dept.or vaccine clinic. Aware to provide a copy of the vaccination record if obtained from local pharmacy or Health Dept. Verbalized acceptance and understanding.  Qualifies for Shingles Vaccine? Yes   Zostavax completed No   Shingrix Completed?: No.    Education has been provided regarding the importance of this vaccine. Patient has been advised to call insurance company to determine out of pocket expense if they have not yet received this vaccine. Advised may also receive vaccine at local pharmacy or Health Dept. Verbalized acceptance and understanding.  Screening Tests Health Maintenance  Topic Date Due   FOOT EXAM  Never done   Zoster Vaccines- Shingrix (1 of 2) Never done   HEMOGLOBIN A1C  11/29/2022   COVID-19 Vaccine (5 - 2023-24 season) 12/18/2022   Diabetic kidney evaluation - eGFR measurement  06/01/2023   Diabetic kidney evaluation - Urine ACR  06/01/2023   OPHTHALMOLOGY EXAM  09/15/2023   Medicare Annual Wellness (AWV)  03/07/2024   Colonoscopy  07/11/2024   DTaP/Tdap/Td (4 - Td or Tdap) 07/15/2027   Pneumonia Vaccine 56+ Years old  Completed   INFLUENZA VACCINE  Completed   Hepatitis C Screening  Completed   HPV VACCINES  Aged Out    Health Maintenance  Health Maintenance Due  Topic Date Due   FOOT EXAM  Never done   Zoster Vaccines- Shingrix (1 of 2) Never done   HEMOGLOBIN A1C  11/29/2022   COVID-19 Vaccine (5 - 2023-24 season) 12/18/2022    Colorectal cancer screening: Type of screening: Colonoscopy. Completed 07/12/19. Repeat every 5 years    Additional Screening:  Hepatitis C Screening: does qualify; Completed  07/14/17  Vision Screening: Recommended annual ophthalmology exams for early detection of glaucoma and other disorders of the eye. Is the patient up to date with their annual eye exam?  Yes  Who is the provider or what is the name of the office in which the patient attends annual eye exams? [facility] If pt is not established with a provider, would they like to be referred to a provider to establish care? No .   Dental Screening: Recommended annual dental exams for proper oral hygiene  Diabetic Foot Exam: Diabetic Foot Exam: Overdue, Pt has been advised about the importance in completing this exam. Pt is scheduled for diabetic foot exam on Followed by PCP.  Community Resource Referral / Chronic Care Management:  CRR required this visit?  No   CCM required this visit?  No     Plan:     I have personally reviewed and noted the following in the patient's chart:   Medical and social history Use  of alcohol, tobacco or illicit drugs  Current medications and supplements including opioid prescriptions. Patient is not currently taking opioid prescriptions. Functional ability and status Nutritional status Physical activity Advanced directives List of other physicians Hospitalizations, surgeries, and ER visits in previous 12 months Vitals Screenings to include cognitive, depression, and falls Referrals and appointments  In addition, I have reviewed and discussed with patient certain preventive protocols, quality metrics, and best practice recommendations. A written personalized care plan for preventive services as well as general preventive health recommendations were provided to patient.     Tillie Rung, LPN   [id number]   After Visit Summary: (MyChart) Due to this being a telephonic visit, the after visit summary with patients personalized plan was offered to patient via MyChart   Nurse Notes: None

## 2023-03-08 NOTE — Patient Instructions (Addendum)
Stephen Woods , Thank you for taking time to come for your Medicare Wellness Visit. I appreciate your ongoing commitment to your health goals. Please review the following plan we discussed and let me know if I can assist you in the future.   Referrals/Orders/Follow-Ups/Clinician Recommendations:   This is a list of the screening recommended for you and due dates:  Health Maintenance  Topic Date Due   Complete foot exam   Never done   Zoster (Shingles) Vaccine (1 of 2) Never done   Hemoglobin A1C  11/29/2022   COVID-19 Vaccine (5 - 2023-24 season) 12/18/2022   Yearly kidney function blood test for diabetes  06/01/2023   Yearly kidney health urinalysis for diabetes  06/01/2023   Eye exam for diabetics  09/15/2023   Medicare Annual Wellness Visit  03/07/2024   Colon Cancer Screening  07/11/2024   DTaP/Tdap/Td vaccine (4 - Td or Tdap) 07/15/2027   Pneumonia Vaccine  Completed   Flu Shot  Completed   Hepatitis C Screening  Completed   HPV Vaccine  Aged Out    Advanced directives: (Copy Requested) Please bring a copy of your health care power of attorney and living will to the office to be added to your chart at your convenience.  Next Medicare Annual Wellness Visit scheduled for next year: Yes

## 2023-03-21 DIAGNOSIS — H25812 Combined forms of age-related cataract, left eye: Secondary | ICD-10-CM | POA: Diagnosis not present

## 2023-03-28 DIAGNOSIS — H25812 Combined forms of age-related cataract, left eye: Secondary | ICD-10-CM | POA: Diagnosis not present

## 2023-03-28 DIAGNOSIS — H26131 Total traumatic cataract, right eye: Secondary | ICD-10-CM | POA: Diagnosis not present

## 2023-03-28 DIAGNOSIS — E119 Type 2 diabetes mellitus without complications: Secondary | ICD-10-CM | POA: Diagnosis not present

## 2023-03-28 DIAGNOSIS — H2512 Age-related nuclear cataract, left eye: Secondary | ICD-10-CM | POA: Diagnosis not present

## 2023-03-28 DIAGNOSIS — I10 Essential (primary) hypertension: Secondary | ICD-10-CM | POA: Diagnosis not present

## 2023-03-28 DIAGNOSIS — Z794 Long term (current) use of insulin: Secondary | ICD-10-CM | POA: Diagnosis not present

## 2023-03-28 DIAGNOSIS — H5703 Miosis: Secondary | ICD-10-CM | POA: Diagnosis not present

## 2023-03-28 DIAGNOSIS — M1990 Unspecified osteoarthritis, unspecified site: Secondary | ICD-10-CM | POA: Diagnosis not present

## 2023-03-28 DIAGNOSIS — H2181 Floppy iris syndrome: Secondary | ICD-10-CM | POA: Diagnosis not present

## 2023-03-28 DIAGNOSIS — H52222 Regular astigmatism, left eye: Secondary | ICD-10-CM | POA: Diagnosis not present

## 2023-04-25 ENCOUNTER — Telehealth: Payer: Self-pay

## 2023-04-25 NOTE — Telephone Encounter (Signed)
 Patient informed to come fasting prior to CPE

## 2023-04-25 NOTE — Telephone Encounter (Signed)
 Copied from CRM [id number]. Topic: Appointments - Appointment Scheduling >> Apr 25, 2023 10:59 AM Farrel B wrote: Patient's spouse called in regards to the physical that he has scheduled and needed to see if he needed to fast before the appointment on the 2/21 of Feb. Please call either patient or spouse to advise on whether his labs are fasting

## 2023-05-24 ENCOUNTER — Other Ambulatory Visit: Payer: Self-pay | Admitting: Family Medicine

## 2023-06-09 ENCOUNTER — Ambulatory Visit: Payer: PPO | Admitting: Family Medicine

## 2023-06-09 VITALS — BP 118/62 | HR 71 | Temp 97.8°F | Ht 67.32 in | Wt 205.3 lb

## 2023-06-09 DIAGNOSIS — N400 Enlarged prostate without lower urinary tract symptoms: Secondary | ICD-10-CM | POA: Diagnosis not present

## 2023-06-09 DIAGNOSIS — E1165 Type 2 diabetes mellitus with hyperglycemia: Secondary | ICD-10-CM | POA: Diagnosis not present

## 2023-06-09 DIAGNOSIS — Z0000 Encounter for general adult medical examination without abnormal findings: Secondary | ICD-10-CM | POA: Diagnosis not present

## 2023-06-09 DIAGNOSIS — I10 Essential (primary) hypertension: Secondary | ICD-10-CM

## 2023-06-09 DIAGNOSIS — E785 Hyperlipidemia, unspecified: Secondary | ICD-10-CM | POA: Diagnosis not present

## 2023-06-09 LAB — LIPID PANEL
Cholesterol: 128 mg/dL (ref 0–200)
HDL: 47.9 mg/dL (ref >39.00)
LDL Cholesterol: 61 mg/dL (ref 0–99)
NonHDL: 79.8
Total CHOL/HDL Ratio: 3
Triglycerides: 94 mg/dL (ref 0.0–149.0)
VLDL: 18.8 mg/dL (ref 0.0–40.0)

## 2023-06-09 LAB — CBC WITH DIFFERENTIAL/PLATELET
Basophils Absolute: 0 10*3/uL (ref 0.0–0.1)
Basophils Relative: 0.7 % (ref 0.0–3.0)
Eosinophils Absolute: 0.3 10*3/uL (ref 0.0–0.7)
Eosinophils Relative: 4.8 % (ref 0.0–5.0)
HCT: 45.9 % (ref 39.0–52.0)
Hemoglobin: 15 g/dL (ref 13.0–17.0)
Lymphocytes Relative: 28.1 % (ref 12.0–46.0)
Lymphs Abs: 1.6 10*3/uL (ref 0.7–4.0)
MCHC: 32.6 g/dL (ref 30.0–36.0)
MCV: 93.8 fL (ref 78.0–100.0)
Monocytes Absolute: 0.7 10*3/uL (ref 0.1–1.0)
Monocytes Relative: 12.8 % — ABNORMAL HIGH (ref 3.0–12.0)
Neutro Abs: 3.1 10*3/uL (ref 1.4–7.7)
Neutrophils Relative %: 53.6 % (ref 43.0–77.0)
Platelets: 175 10*3/uL (ref 150.0–400.0)
RBC: 4.89 Mil/uL (ref 4.22–5.81)
RDW: 14.5 % (ref 11.5–15.5)
WBC: 5.7 10*3/uL (ref 4.0–10.5)

## 2023-06-09 LAB — HEPATIC FUNCTION PANEL
ALT: 23 U/L (ref 0–53)
AST: 21 U/L (ref 0–37)
Albumin: 4.4 g/dL (ref 3.5–5.2)
Alkaline Phosphatase: 50 U/L (ref 39–117)
Bilirubin, Direct: 0.2 mg/dL (ref 0.0–0.3)
Total Bilirubin: 0.7 mg/dL (ref 0.2–1.2)
Total Protein: 7.9 g/dL (ref 6.0–8.3)

## 2023-06-09 LAB — MICROALBUMIN / CREATININE URINE RATIO
Creatinine,U: 124 mg/dL
Microalb Creat Ratio: 44.1 mg/g — ABNORMAL HIGH (ref 0.0–30.0)
Microalb, Ur: 5.5 mg/dL — ABNORMAL HIGH (ref 0.0–1.9)

## 2023-06-09 LAB — BASIC METABOLIC PANEL
BUN: 17 mg/dL (ref 6–23)
CO2: 29 meq/L (ref 19–32)
Calcium: 9.3 mg/dL (ref 8.4–10.5)
Chloride: 103 meq/L (ref 96–112)
Creatinine, Ser: 0.81 mg/dL (ref 0.40–1.50)
GFR: 88.21 mL/min (ref >60.00)
Glucose, Bld: 124 mg/dL — ABNORMAL HIGH (ref 70–99)
Potassium: 5 meq/L (ref 3.5–5.1)
Sodium: 139 meq/L (ref 135–145)

## 2023-06-09 LAB — HEMOGLOBIN A1C: Hgb A1c MFr Bld: 6.6 % — ABNORMAL HIGH (ref 4.6–6.5)

## 2023-06-09 NOTE — Patient Instructions (Signed)
Discontinue daily aspirin.   Consider Shingrix vaccine.

## 2023-06-09 NOTE — Progress Notes (Signed)
Established Patient Office Visit  Subjective   Patient ID: Stephen Woods, male    DOB: 1950/08/18  Age: 73 y.o. MRN: [id number]  No chief complaint on file.   HPI   Mr. Pember is seen for physical exam and medical follow-up.  He has history of hypertension, type 2 diabetes, osteoarthritis, BPH, hyperlipidemia.  He had previous right total hip replacement.  Has not been exercising or as active over the wintertime.  Denies any specific complaints.  Appetite and weight stable.  He has type 2 diabetes.  Does monitor blood sugars periodically and these have been stable and well-controlled.  A1c's have consistently been well-controlled.  Does not monitor blood pressure regularly.  Health maintenance reviewed:  Health Maintenance  Topic Date Due   FOOT EXAM  Never done   HEMOGLOBIN A1C  11/29/2022   COVID-19 Vaccine (5 - 2024-25 season) 12/18/2022   Diabetic kidney evaluation - eGFR measurement  06/01/2023   Diabetic kidney evaluation - Urine ACR  06/01/2023   Zoster Vaccines- Shingrix (1 of 2) 09/06/2023 (Originally 02/16/1970)   OPHTHALMOLOGY EXAM  09/15/2023   Medicare Annual Wellness (AWV)  03/07/2024   Colonoscopy  07/11/2024   DTaP/Tdap/Td (4 - Td or Tdap) 07/15/2027   Pneumonia Vaccine 71+ Years old  Completed   INFLUENZA VACCINE  Completed   Hepatitis C Screening  Completed   HPV VACCINES  Aged Out   -Declined shingles vaccine.  Social history-married.  He has a son and no grandchildren.  No history of smoking.  Drinks 2 to 3 ounces of wine several days per week.  Family history-history of coronary disease in both parents.  Brother with CAD and type 2 diabetes history.  No cancer history.  Past Medical History:  Diagnosis Date   Arthritis    Cancer (HCC)    skin cancer forehead    Erectile dysfunction    Hyperlipidemia    Hypertension    controlled on meds    Past Surgical History:  Procedure Laterality Date   COLONOSCOPY  07/12/2019   TOTAL HIP ARTHROPLASTY  Right 05/10/2016   Procedure: RIGHT TOTAL HIP ARTHROPLASTY ANTERIOR APPROACH;  Surgeon: Durene Romans, MD;  Location: [facility];  Service: Orthopedics;  Laterality: Right;    reports that he has never smoked. He has quit using smokeless tobacco.  His smokeless tobacco use included snuff. He reports current alcohol use of about 7.0 standard drinks of alcohol per week. He reports that he does not use drugs. family history includes Arthritis in his mother; Diabetes in his brother; Heart disease (age of onset: 36) in his father; Heart disease (age of onset: 64) in his mother; Heart disease (age of onset: 32) in his brother; Hypertension in his father; Stroke in his father. Allergies  Allergen Reactions   Ampicillin Hives    hives    Review of Systems  Constitutional:  Negative for chills, fever, malaise/fatigue and weight loss.  HENT:  Negative for hearing loss.   Eyes:  Negative for blurred vision and double vision.  Respiratory:  Negative for cough and shortness of breath.   Cardiovascular:  Negative for chest pain, palpitations and leg swelling.  Gastrointestinal:  Negative for abdominal pain, blood in stool, constipation and diarrhea.  Genitourinary:  Negative for dysuria.  Skin:  Negative for rash.  Neurological:  Negative for dizziness, speech change, seizures, loss of consciousness and headaches.  Psychiatric/Behavioral:  Negative for depression.       Objective:     BP 118/62 (  BP Location: Left Arm, Cuff Size: Normal)   Pulse 71   Temp 97.8 F (36.6 C) (Oral)   Ht 5' 7.32" (1.71 m)   Wt 205 lb 4.8 oz (93.1 kg)   SpO2 95%   BMI 31.85 kg/m  BP Readings from Last 3 Encounters:  06/09/23 118/62  08/29/22 126/60  08/19/22 (!) 146/90   Wt Readings from Last 3 Encounters:  06/09/23 205 lb 4.8 oz (93.1 kg)  03/08/23 193 lb (87.5 kg)  08/29/22 202 lb 6.4 oz (91.8 kg)      Physical Exam Vitals reviewed.  Constitutional:      General: He is not in acute distress.     Appearance: He is well-developed.  HENT:     Head: Normocephalic and atraumatic.     Right Ear: External ear normal.     Left Ear: External ear normal.  Eyes:     Conjunctiva/sclera: Conjunctivae normal.     Pupils: Pupils are equal, round, and reactive to light.  Neck:     Thyroid: No thyromegaly.  Cardiovascular:     Rate and Rhythm: Normal rate and regular rhythm.     Heart sounds: Normal heart sounds. No murmur heard. Pulmonary:     Effort: No respiratory distress.     Breath sounds: No wheezing or rales.  Abdominal:     General: Bowel sounds are normal. There is no distension.     Palpations: Abdomen is soft. There is no mass.     Tenderness: There is no abdominal tenderness. There is no guarding or rebound.  Musculoskeletal:     Cervical back: Normal range of motion and neck supple.  Lymphadenopathy:     Cervical: No cervical adenopathy.  Skin:    Findings: No rash.     Comments: He has some bluish discoloration of both feet consistent with venous stasis but good dorsalis pedis pulses and good capillary refill bilaterally.  Small cutaneous horn right forehead.  Neurological:     Mental Status: He is alert and oriented to person, place, and time.     Cranial Nerves: No cranial nerve deficit.      No results found for any visits on 06/09/23.  Last CBC Lab Results  Component Value Date   WBC 4.6 05/31/2022   HGB 14.5 05/31/2022   HCT 43.4 05/31/2022   MCV 90.5 05/31/2022   MCH 31.2 07/14/2017   RDW 14.3 05/31/2022   PLT 198.0 05/31/2022   Last metabolic panel Lab Results  Component Value Date   GLUCOSE 108 (H) 05/31/2022   NA 139 05/31/2022   K 5.3 No hemolysis seen (H) 05/31/2022   CL 101 05/31/2022   CO2 30 05/31/2022   BUN 18 05/31/2022   CREATININE 0.78 05/31/2022   GFR 89.86 05/31/2022   CALCIUM 9.8 05/31/2022   PROT 7.5 05/31/2022   ALBUMIN 4.4 05/31/2022   BILITOT 0.6 05/31/2022   ALKPHOS 54 05/31/2022   AST 19 05/31/2022   ALT 20 05/31/2022    ANIONGAP 7 05/11/2016   Last lipids Lab Results  Component Value Date   CHOL 129 05/31/2022   HDL 52.80 05/31/2022   LDLCALC 61 05/31/2022   LDLDIRECT 166.6 03/05/2012   TRIG 76.0 05/31/2022   CHOLHDL 2 05/31/2022   Last hemoglobin A1c Lab Results  Component Value Date   HGBA1C 6.3 05/31/2022      The ASCVD Risk score (Arnett DK, et al., 2019) failed to calculate for the following reasons:   The valid total cholesterol  range is 130 to 320 mg/dL    Assessment & Plan:   Problem List Items Addressed This Visit       Unprioritized   Type 2 diabetes mellitus with hyperglycemia (HCC)   Relevant Orders   CBC with Differential/Platelet   Hemoglobin A1c   Microalbumin / creatinine urine ratio   BPH (benign prostatic hyperplasia)   Hyperlipidemia   Relevant Orders   Lipid panel   Hepatic function panel   Hypertension - Primary   Relevant Orders   Basic metabolic panel   Other Visit Diagnoses       Physical exam       Relevant Orders   CBC with Differential/Platelet     Patient here for physical exam.  Chronic medical problems as above.  Due for follow-up labs.  We discussed several health maintenance issues as follows  -Patient still taking baby aspirin.  Given his age of 57 we recommend he consider discontinuation based on American Heart Association guidelines without clear indication  -Continue annual flu vaccine.  Pneumonia vaccines completed  -Discussed Shingrix vaccine but he declines  -Obtain multiple follow-up labs as above  -We have recommended he try to increase his physical activity with exercise such as brisk walking several days per week  No follow-ups on file.    Evelena Peat, MD

## 2023-06-23 ENCOUNTER — Encounter: Payer: Self-pay | Admitting: Family Medicine

## 2023-06-23 MED ORDER — BLOOD GLUCOSE MONITOR KIT: PACK | 0 refills | Status: DC

## 2023-06-26 MED ORDER — TAMSULOSIN HCL 0.4 MG PO CAPS: 0.4000 mg | ORAL_CAPSULE | Freq: Every day | ORAL | 3 refills | Status: DC

## 2023-06-26 NOTE — Addendum Note (Signed)
 Addended by: Christy Sartorius on: 06/26/2023 10:55 AM   Modules accepted: Orders

## 2023-06-27 MED ORDER — BLOOD GLUCOSE MONITOR KIT: PACK | 0 refills | Status: DC

## 2023-06-27 MED ORDER — TAMSULOSIN HCL 0.4 MG PO CAPS: 0.4000 mg | ORAL_CAPSULE | Freq: Every day | ORAL | 3 refills | Status: DC

## 2023-06-27 NOTE — Addendum Note (Signed)
 Addended by: Philipp Deputy A on: 06/27/2023 01:48 PM   Modules accepted: Orders

## 2023-07-18 DIAGNOSIS — W908XXS Exposure to other nonionizing radiation, sequela: Secondary | ICD-10-CM | POA: Diagnosis not present

## 2023-07-18 DIAGNOSIS — L578 Other skin changes due to chronic exposure to nonionizing radiation: Secondary | ICD-10-CM | POA: Diagnosis not present

## 2023-07-18 DIAGNOSIS — D485 Neoplasm of uncertain behavior of skin: Secondary | ICD-10-CM | POA: Diagnosis not present

## 2023-07-18 DIAGNOSIS — L570 Actinic keratosis: Secondary | ICD-10-CM | POA: Diagnosis not present

## 2023-07-18 DIAGNOSIS — L820 Inflamed seborrheic keratosis: Secondary | ICD-10-CM | POA: Diagnosis not present

## 2023-07-26 ENCOUNTER — Encounter: Payer: Self-pay | Admitting: Family Medicine

## 2023-07-26 MED ORDER — TAMSULOSIN HCL 0.4 MG PO CAPS: 0.4000 mg | ORAL_CAPSULE | Freq: Every day | ORAL | 0 refills | Status: AC

## 2023-08-22 ENCOUNTER — Other Ambulatory Visit: Payer: Self-pay | Admitting: Family Medicine

## 2023-09-14 ENCOUNTER — Other Ambulatory Visit: Payer: Self-pay | Admitting: Family Medicine

## 2023-09-25 ENCOUNTER — Other Ambulatory Visit: Payer: Self-pay | Admitting: Family Medicine

## 2023-11-21 ENCOUNTER — Other Ambulatory Visit: Payer: Self-pay | Admitting: Family Medicine

## 2024-02-07 ENCOUNTER — Telehealth: Payer: Self-pay | Admitting: Family Medicine

## 2024-02-07 NOTE — Telephone Encounter (Signed)
 Spoke with patient to schedule AWV.  He stated he has moved to [facility] and has new pcp

## 2024-02-07 NOTE — Telephone Encounter (Signed)
 Noted
